# Patient Record
Sex: Male | Born: 1975 | Race: Black or African American | Hispanic: No | Marital: Single | Smoking: Former smoker
Health system: Southern US, Community
[De-identification: ages and names within clinical notes are randomized; demographics above are authoritative.]

## PROBLEM LIST (undated history)

## (undated) DIAGNOSIS — G4730 Sleep apnea, unspecified: Secondary | ICD-10-CM

## (undated) DIAGNOSIS — E785 Hyperlipidemia, unspecified: Secondary | ICD-10-CM

## (undated) DIAGNOSIS — I10 Essential (primary) hypertension: Secondary | ICD-10-CM

## (undated) HISTORY — DX: Essential (primary) hypertension: I10

## (undated) HISTORY — DX: Hyperlipidemia, unspecified: E78.5

## (undated) HISTORY — DX: Sleep apnea, unspecified: G47.30

## (undated) HISTORY — PX: WISDOM TOOTH EXTRACTION: SHX21

---

## 2003-10-21 DIAGNOSIS — I10 Essential (primary) hypertension: Secondary | ICD-10-CM

## 2003-10-21 HISTORY — DX: Essential (primary) hypertension: I10

## 2011-04-28 ENCOUNTER — Encounter: Payer: Self-pay | Admitting: Physician Assistant

## 2011-05-08 HISTORY — PX: FLEXIBLE SIGMOIDOSCOPY: SHX1649

## 2017-01-02 ENCOUNTER — Emergency Department (HOSPITAL_COMMUNITY): Payer: Self-pay

## 2017-01-02 ENCOUNTER — Emergency Department (HOSPITAL_COMMUNITY)
Admission: EM | Admit: 2017-01-02 | Discharge: 2017-01-02 | Disposition: A | Discharge status: Home / Self Care | Payer: Self-pay | Attending: Emergency Medicine | Admitting: Emergency Medicine

## 2017-01-02 ENCOUNTER — Encounter (HOSPITAL_COMMUNITY): Payer: Self-pay | Admitting: Nurse Practitioner

## 2017-01-02 DIAGNOSIS — X509XXA Other and unspecified overexertion or strenuous movements or postures, initial encounter: Secondary | ICD-10-CM | POA: Insufficient documentation

## 2017-01-02 DIAGNOSIS — Y9301 Activity, walking, marching and hiking: Secondary | ICD-10-CM | POA: Insufficient documentation

## 2017-01-02 DIAGNOSIS — Y999 Unspecified external cause status: Secondary | ICD-10-CM | POA: Insufficient documentation

## 2017-01-02 DIAGNOSIS — Y929 Unspecified place or not applicable: Secondary | ICD-10-CM | POA: Insufficient documentation

## 2017-01-02 DIAGNOSIS — S82831A Other fracture of upper and lower end of right fibula, initial encounter for closed fracture: Secondary | ICD-10-CM | POA: Insufficient documentation

## 2017-01-02 MED ORDER — OXYCODONE-ACETAMINOPHEN 5-325 MG PO TABS: 2.0000 | ORAL_TABLET | ORAL | 0 refills | Status: DC | PRN

## 2017-01-02 NOTE — ED Notes (Signed)
Bed: WTR8 Expected date:  Expected time:  Means of arrival:  Comments: 

## 2017-01-02 NOTE — Discharge Instructions (Signed)
Your x-rays showed an oblique fracture of your distal right fibula.  You need to call orthopedic surgeon as soon as you're able to be evaluated in clinic.  Please wear your splint and use crutches to ambulate until you are evaluated by orthopedic surgeon.  The orthopedic surgeon will determine further treatment necessary.   Take percocet for severe pain.  You can take 600 mg ibuprofen + 1000 mg tylenol for mild/moderate pain. Elevate your leg as often as possible when sitting or laying down.  You may ice if you are able to through the splint.    There is always a risk of compartment syndrome with fractures of large bones.  Return to the ED immediately if you have acutely worsening pain to injured area, area feels "tight", hot and severely swollen.

## 2017-01-02 NOTE — ED Provider Notes (Signed)
[facility]-EMERGENCY DEPT Provider Note   CSN: [id number] Arrival date & time: 01/02/17  1613  By signing my name below, I, Sonum Patel, attest that this documentation has been prepared under the direction and in the presence of Sharen Heck, PA-C. Electronically Signed: Leone Payor, Scribe. 01/02/17. 5:26 PM.  History   Chief Complaint Chief Complaint  Patient presents with  . Ankle Pain    Right ankle     The history is provided by the patient. No language interpreter was used.     HPI Comments: Joel [name] is a 41 y.o. male who presents to the Emergency Department complaining of a left ankle injury that occurred this morning. Patient states he was walking down steps when he inverted his left ankle. He denies hearing any popping or cracking noises when the injury occurred. He currently complains of constant, gradually worsened left ankle pain with associated swelling. Patient states initially he was able to take a few steps but then the pain and swelling worsened. He has elevated the affected area and applied ice without relief. He denies numbness, paresthesia.   History reviewed. No pertinent past medical history.  There are no active problems to display for this patient.   History reviewed. No pertinent surgical history.     Home Medications    Prior to Admission medications   Medication Sig Start Date End Date Taking? Authorizing Provider  oxyCODONE-acetaminophen (PERCOCET/ROXICET) 5-325 MG tablet Take 2 tablets by mouth every 4 (four) hours as needed for severe pain. 01/02/17   Liberty Handy, PA-C    Family History History reviewed. No pertinent family history.  Social History Social History  Substance Use Topics  . Smoking status: Never Smoker  . Smokeless tobacco: Never Used  . Alcohol use No     Allergies   Patient has no known allergies.   Review of Systems Review of Systems  Musculoskeletal: Positive for arthralgias and joint swelling.    Neurological: Negative for numbness.     Physical Exam Updated Vital Signs BP 132/85 (BP Location: Left Arm)   Pulse 78   Temp 98.9 F (37.2 C) (Oral)   Resp 18   Ht 5\' 7"  (1.702 m)   Wt 74.8 kg   SpO2 99%   BMI 25.84 kg/m   Physical Exam  Constitutional: He is oriented to person, place, and time. He appears well-developed and well-nourished. No distress.  HENT:  Head: Normocephalic and atraumatic.  Right Ear: External ear normal.  Left Ear: External ear normal.  Nose: Nose normal.  Mouth/Throat: Oropharynx is clear and moist. No oropharyngeal exudate.  Eyes: Conjunctivae and EOM are normal. Pupils are equal, round, and reactive to light. No scleral icterus.  Neck: Normal range of motion. Neck supple. No JVD present. No tracheal deviation present.  Cardiovascular: Normal rate, regular rhythm, normal heart sounds and intact distal pulses.   No murmur heard. Pulmonary/Chest: Effort normal and breath sounds normal. He has no wheezes.  Abdominal: Soft. He exhibits no distension. There is no tenderness.  Musculoskeletal: Normal range of motion. He exhibits edema and tenderness. He exhibits no deformity.  Tenderness to distal fibula, posterior lateral malleolus and over ATLF. Obvious lateral ankle and lateral lower leg edema which is also mild. + Talar test  No obvious ecchymosis, erythema or skin abrasions. Full passive ROM of bilateral ankles with pain but without crepitus.  Patient able to bear weight in ED (4+ steps).   No bony tenderness over posterior aspect of medial malleoli, navicular  bone or 5th metatarsal base.   Achilles tendon is non tender. Negative anterior and posterior drawer.  Negative syndesmosis squeeze test. Negative Thompson test.    Lymphadenopathy:    He has no cervical adenopathy.  Neurological: He is alert and oriented to person, place, and time. No sensory deficit.  Antalgic gait favoring RLE  5/5 strength with ankle dorsiflexion and plantar  flexion, bilaterally.  Sensation to light touch intact in the distribution of the obturator nerve, lateral cutaneous nerve, femoral nerve, common fibular nerve and peroneal nerve.  Foot: sensation to light touch intact in the distribution of the saphenous nerve, medial plantar nerve, lateral plantar nerve, bilaterally.   Skin: Skin is warm and dry. Capillary refill takes less than 2 seconds.  Psychiatric: He has a normal mood and affect. His behavior is normal. Judgment and thought content normal.  Nursing note and vitals reviewed.    ED Treatments / Results  DIAGNOSTIC STUDIES: Oxygen Saturation is 98% on RA, normal by my interpretation.    COORDINATION OF CARE: 5:23 PM Discussed treatment plan with pt at bedside and pt agreed to plan.   Labs (all labs ordered are listed, but only abnormal results are displayed) Labs Reviewed - No data to display  EKG  EKG Interpretation None       Radiology Dg Ankle Complete Right  Result Date: 01/02/2017 CLINICAL DATA:  Pain swelling.  Fall . EXAM: RIGHT ANKLE - COMPLETE 3+ VIEW COMPARISON:  No recent prior . FINDINGS: Prominent soft tissue swelling noted laterally. No oblique fracture of the distal fibula is noted. Slight displacement. Medial malleolus intact. IMPRESSION: Oblique fracture of the distal fibula.  Slight displacement. Electronically Signed   By: Maisie Fus  Register   On: 01/02/2017 17:33    Procedures Procedures (including critical care time)  Medications Ordered in ED Medications - No data to display   Initial Impression / Assessment and Plan / ED Course  I have reviewed the triage vital signs and the nursing notes.  Pertinent labs & imaging results that were available during my care of the patient were reviewed by me and considered in my medical decision making (see chart for details).  Clinical Course as of Jan 03 1955  Fri Jan 02, 2017  1752 FINDINGS: Prominent soft tissue swelling noted laterally. No oblique  fracture of the distal fibula is noted. Slight displacement. Medial malleolus intact.  IMPRESSION: Oblique fracture of the distal fibula. Slight displacement.  [CG]    Clinical Course User Index [CG] Liberty Handy, PA-C   X-ray showed oblique fracture of the distal fibula without involvement of the ankle joint. Marena Chancy B fracture. Right lower extremity is neurovascularly intact. No break in the skin. No signs of compartment syndrome. Discussed x-ray findings with patient. We'll discharge with short course of Percocet, splint, crutches and follow up with orthopedic surgeon for reevaluation and possible surgery. Patient verbalized understanding and is agreeable to discharge plan.  X-rays and discharge plan were discussed with supervising physician who agrees with ED treatment and disposition.   Final Clinical Impressions(s) / ED Diagnoses   Final diagnoses:  Other closed fracture of distal end of right fibula, initial encounter    New Prescriptions Discharge Medication List as of 01/02/2017  6:29 PM    START taking these medications   Details  oxyCODONE-acetaminophen (PERCOCET/ROXICET) 5-325 MG tablet Take 2 tablets by mouth every 4 (four) hours as needed for severe pain., Starting Fri 01/02/2017, Print       I personally performed  the services described in this documentation, which was scribed in my presence. The recorded information has been reviewed and is accurate.    Liberty Handylaudia J Angeles Zehner, PA-C 01/02/17 1956    Melene Planan Floyd, DO 01/02/17 2327

## 2017-01-02 NOTE — ED Triage Notes (Signed)
Pt states he twisted his right ankle putting all his weight on the foot, obvious swelling and inflammation, pt states icing and elevation have not helped.

## 2017-01-30 ENCOUNTER — Ambulatory Visit (HOSPITAL_COMMUNITY)
Admission: EM | Admit: 2017-01-30 | Discharge: 2017-01-30 | Disposition: A | Discharge status: Home / Self Care | Payer: Self-pay | Attending: Family Medicine | Admitting: Family Medicine

## 2017-01-30 ENCOUNTER — Encounter (HOSPITAL_COMMUNITY): Payer: Self-pay | Admitting: Family Medicine

## 2017-01-30 DIAGNOSIS — H9202 Otalgia, left ear: Secondary | ICD-10-CM

## 2017-01-30 DIAGNOSIS — H6002 Abscess of left external ear: Secondary | ICD-10-CM

## 2017-01-30 MED ORDER — AMOXICILLIN-POT CLAVULANATE 875-125 MG PO TABS: 1.0000 | ORAL_TABLET | Freq: Two times a day (BID) | ORAL | 0 refills | Status: DC

## 2017-01-30 NOTE — ED Triage Notes (Signed)
Pt here for left ear pain x 2 days 

## 2017-01-30 NOTE — ED Provider Notes (Signed)
CSN: [id number]     Arrival date & time 01/30/17  1030 History   None    Chief Complaint  Patient presents with  . Otalgia   (Consider location/radiation/quality/duration/timing/severity/associated sxs/prior Treatment) Patient c/o left ear pain for 2 days.   The history is provided by the patient.  Otalgia  Location:  Left Behind ear:  Redness and swelling Quality:  Aching Severity:  Moderate Onset quality:  Sudden Duration:  2 days Timing:  Constant Progression:  Worsening Chronicity:  New Relieved by:  Nothing Worsened by:  Nothing Ineffective treatments:  None tried   History reviewed. No pertinent past medical history. History reviewed. No pertinent surgical history. History reviewed. No pertinent family history. Social History  Substance Use Topics  . Smoking status: Never Smoker  . Smokeless tobacco: Never Used  . Alcohol use No    Review of Systems  Constitutional: Negative.   HENT: Positive for ear pain.   Eyes: Negative.   Respiratory: Negative.   Cardiovascular: Negative.   Gastrointestinal: Negative.   Endocrine: Negative.   Genitourinary: Negative.   Musculoskeletal: Negative.   Skin: Positive for wound.  Allergic/Immunologic: Negative.   Neurological: Negative.   Hematological: Negative.   Psychiatric/Behavioral: Negative.     Allergies  Patient has no known allergies.  Home Medications   Prior to Admission medications   Medication Sig Start Date End Date Taking? Authorizing Provider  amoxicillin-clavulanate (AUGMENTIN) 875-125 MG tablet Take 1 tablet by mouth 2 (two) times daily. 01/30/17   Deatra Canter, FNP  oxyCODONE-acetaminophen (PERCOCET/ROXICET) 5-325 MG tablet Take 2 tablets by mouth every 4 (four) hours as needed for severe pain. 01/02/17   Liberty Handy, PA-C   Meds Ordered and Administered this Visit  Medications - No data to display  BP 126/70   Pulse 98   Temp 98.6 F (37 C)   Resp 18   SpO2 99%  No data  found.   Physical Exam  Constitutional: He appears well-developed and well-nourished.  HENT:  Head: Normocephalic and atraumatic.  Right Ear: External ear normal.  Mouth/Throat: Oropharynx is clear and moist.  Left Tragus with cellulitis and cyst  Eyes: Conjunctivae and EOM are normal. Pupils are equal, round, and reactive to light.  Neck: Normal range of motion. Neck supple.  Cardiovascular: Normal rate, regular rhythm and normal heart sounds.   Pulmonary/Chest: Effort normal and breath sounds normal.  Abdominal: Soft. Bowel sounds are normal.  Nursing note and vitals reviewed.   Urgent Care Course     .Marland KitchenIncision and Drainage Date/Time: 01/30/2017 11:27 AM Performed by: Deatra Canter Authorized by: Hazeline Junker B   Consent:    Consent obtained:  Verbal   Consent given by:  Patient   Risks discussed:  Bleeding   Alternatives discussed:  No treatment Location:    Type:  Abscess   Size:  1   Location:  Head   Head location:  L external ear Pre-procedure details:    Skin preparation:  Betadine Anesthesia (see MAR for exact dosages):    Anesthesia method:  Local infiltration   Local anesthetic:  Lidocaine 1% w/o epi Procedure type:    Complexity:  Simple Procedure details:    Needle aspiration: yes     Needle size:  18 G   Incision types:  Stab incision   Incision depth:  Dermal   Drainage:  Purulent   Drainage amount:  Scant   Wound treatment:  Wound left open   Packing materials:  None   (  including critical care time)  Labs Review Labs Reviewed - No data to display  Imaging Review No results found.   Visual Acuity Review  Right Eye Distance:   Left Eye Distance:   Bilateral Distance:    Right Eye Near:   Left Eye Near:    Bilateral Near:         MDM   1. Otalgia of left ear   2. Abscess of tragus of left ear    Incision and Drainage Augmentin  one po bid x10 days #20  Motrin and Tylenol otc as directed      Deatra Canter, FNP 01/30/17 1129

## 2018-02-19 ENCOUNTER — Ambulatory Visit: Payer: BLUE CROSS/BLUE SHIELD | Admitting: Physician Assistant

## 2018-02-19 ENCOUNTER — Encounter: Payer: Self-pay | Admitting: Physician Assistant

## 2018-02-19 VITALS — BP 120/92 | HR 79 | Temp 98.6°F | Ht 66.5 in | Wt 172.0 lb

## 2018-02-19 DIAGNOSIS — I10 Essential (primary) hypertension: Secondary | ICD-10-CM | POA: Diagnosis not present

## 2018-02-19 DIAGNOSIS — Z7689 Persons encountering health services in other specified circumstances: Secondary | ICD-10-CM

## 2018-02-19 DIAGNOSIS — Z23 Encounter for immunization: Secondary | ICD-10-CM

## 2018-02-19 DIAGNOSIS — R1030 Lower abdominal pain, unspecified: Secondary | ICD-10-CM

## 2018-02-19 LAB — URINALYSIS, ROUTINE W REFLEX MICROSCOPIC
Bilirubin Urine: NEGATIVE
Hgb urine dipstick: NEGATIVE
KETONES UR: NEGATIVE
Leukocytes, UA: NEGATIVE
Nitrite: NEGATIVE
PH: 6.5 (ref 5.0–8.0)
RBC / HPF: NONE SEEN (ref 0–?)
SPECIFIC GRAVITY, URINE: 1.02 (ref 1.000–1.030)
Total Protein, Urine: NEGATIVE
UROBILINOGEN UA: 0.2 (ref 0.0–1.0)
Urine Glucose: NEGATIVE

## 2018-02-19 LAB — CBC
HEMATOCRIT: 44.5 % (ref 39.0–52.0)
HEMOGLOBIN: 15.3 g/dL (ref 13.0–17.0)
MCHC: 34.4 g/dL (ref 30.0–36.0)
MCV: 94 fl (ref 78.0–100.0)
Platelets: 184 10*3/uL (ref 150.0–400.0)
RBC: 4.74 Mil/uL (ref 4.22–5.81)
RDW: 14 % (ref 11.5–15.5)
WBC: 3.6 10*3/uL — ABNORMAL LOW (ref 4.0–10.5)

## 2018-02-19 LAB — LIPASE: Lipase: 10 U/L — ABNORMAL LOW (ref 11.0–59.0)

## 2018-02-19 LAB — COMPREHENSIVE METABOLIC PANEL
ALK PHOS: 79 U/L (ref 39–117)
ALT: 27 U/L (ref 0–53)
AST: 23 U/L (ref 0–37)
Albumin: 4.1 g/dL (ref 3.5–5.2)
BILIRUBIN TOTAL: 0.5 mg/dL (ref 0.2–1.2)
BUN: 13 mg/dL (ref 6–23)
CALCIUM: 9.5 mg/dL (ref 8.4–10.5)
CO2: 31 mEq/L (ref 19–32)
CREATININE: 1.04 mg/dL (ref 0.40–1.50)
Chloride: 103 mEq/L (ref 96–112)
GFR: 99.95 mL/min (ref >60.00)
Glucose, Bld: 93 mg/dL (ref 70–99)
Potassium: 4.7 mEq/L (ref 3.5–5.1)
Sodium: 140 mEq/L (ref 135–145)
TOTAL PROTEIN: 7.2 g/dL (ref 6.0–8.3)

## 2018-02-19 NOTE — Progress Notes (Signed)
Joel [name] is a 42 y.o. male here to Establish Care.  I acted as a Neurosurgeon for [employer], PA-C Joel Mull, LPN  History of Present Illness:   Chief Complaint  Patient presents with  . Establish Care    BC/BS  . Abdominal Pain    Acute Concerns: Hypertension -- stopped medication; currently asymptomatic. Does snore at night. Smokes 1 pack per week.  1 coffee daily. No energy drinks. Occasional soda. Tries to limit salt. More likely to eat out at home.  Denies chest pain, shortness of breath, unusual headaches, lightheadedness, dizziness, lower leg swelling.  Loculated he was started on Toprol and hydrochlorothiazide.  After he left he stopped taking the medication, and felt that he needed anymore.  He does drink approximately 1.5 pints of liquor a week. Abdominal pain --about 1 month ago he started having symptoms.  He would have sharp pain about 1 minute and it would go away.  He said it was related to him drinking alcohol on empty stomach.  When he would eat or have a BM it would get better. Denies constipation, nausea, vomiting.  Denies any blood in his stool.  He does report a history of having rectal bleeding and has had a colonoscopy which to his recollection was normal; we are requesting records of this today.  For the past 2 weeks he has had absolutely no symptoms and the issue has essentially resolved.  Denies issues with urination or concern for STDs.  Health Maintenance: Immunizations -- UTD, Tdap given today Colonoscopy -- N/A PSA -- N/A Exercise -- slacks off Weight -- Weight: 172 lb (78 kg)   Depression screen PHQ 2/9 02/19/2018  Decreased Interest 0  Down, Depressed, Hopeless 0  PHQ - 2 Score 0    No flowsheet data found.  Other providers/specialists: none  Past Medical History:  Diagnosis Date  . Hypertension 2005   stopped meds in 2009     Social History   Socioeconomic History  . Marital status: Married    Spouse name: Not on file  .  Number of children: Not on file  . Years of education: Not on file  . Highest education level: Not on file  Occupational History  . Not on file  Social Needs  . Financial resource strain: Not on file  . Food insecurity:    Worry: Not on file    Inability: Not on file  . Transportation needs:    Medical: Not on file    Non-medical: Not on file  Tobacco Use  . Smoking status: Current Every Day Smoker    Types: Cigarettes  . Smokeless tobacco: Never Used  . Tobacco comment: 1 pack per week  Substance and Sexual Activity  . Alcohol use: Yes    Comment: 1.5 pints a week of liquor [name]  . Drug use: Not Currently    Types: Marijuana    Comment: 4 years ago  . Sexual activity: Yes  Lifestyle  . Physical activity:    Days per week: Not on file    Minutes per session: Not on file  . Stress: Not on file  Relationships  . Social connections:    Talks on phone: Not on file    Gets together: Not on file    Attends religious service: Not on file    Active member of club or organization: Not on file    Attends meetings of clubs or organizations: Not on file    Relationship status: Not  on file  . Intimate partner violence:    Fear of current or ex partner: Not on file    Emotionally abused: Not on file    Physically abused: Not on file    Forced sexual activity: Not on file  Other Topics Concern  . Not on file  Social History Narrative   Work for [employer] in [place]   1 daughter -- 44 y/o   Not married    Past Surgical History:  Procedure Laterality Date  . WISDOM TOOTH EXTRACTION      Family History  Problem Relation Age of Onset  . Hypertension Mother   . Hypertension Sister     No Known Allergies   Current Medications:  No current outpatient medications on file.   Review of Systems:   Review of Systems  Constitutional: Negative.  Negative for chills, fever, malaise/fatigue and weight loss.  HENT: Negative.  Negative for hearing loss, sinus pain and sore  throat.   Eyes: Negative.  Negative for blurred vision.  Respiratory: Negative.  Negative for cough and shortness of breath.   Cardiovascular: Negative.  Negative for chest pain, palpitations and leg swelling.  Gastrointestinal: Positive for abdominal pain (x 1 month). Negative for constipation, diarrhea, heartburn, nausea and vomiting.  Genitourinary: Negative.  Negative for dysuria, frequency and urgency.  Musculoskeletal: Negative for back pain, myalgias and neck pain.  Skin: Negative.  Negative for itching and rash.  Neurological: Negative.  Negative for dizziness, tingling, seizures, loss of consciousness and headaches.  Endo/Heme/Allergies: Negative.  Negative for polydipsia.  Psychiatric/Behavioral: Negative.  Negative for depression. The patient is not nervous/anxious.     Vitals:   Vitals:   02/19/18 1150  BP: (!) 126/100  Pulse: 79  Temp: 98.6 F (37 C)  TempSrc: Oral  SpO2: 98%  Weight: 172 lb (78 kg)  Height: 5' 6.5" (1.689 m)     Body mass index is 27.35 kg/m.  Physical Exam:   Physical Exam  Constitutional: He appears well-developed. He is cooperative.  Non-toxic appearance. He does not have a sickly appearance. He does not appear ill. No distress.  Cardiovascular: Normal rate, regular rhythm, S1 normal, S2 normal, normal heart sounds and normal pulses.  No LE edema  Pulmonary/Chest: Effort normal and breath sounds normal.  Abdominal: Normal appearance and bowel sounds are normal. There is no tenderness. There is no rigidity, no rebound, no guarding, no CVA tenderness, no tenderness at McBurney's point and negative Murphy's sign.  Neurological: He is alert. GCS eye subscore is 4. GCS verbal subscore is 5. GCS motor subscore is 6.  Skin: Skin is warm, dry and intact.  Psychiatric: He has a normal mood and affect. His speech is normal and behavior is normal.  Nursing note and vitals reviewed.  EKG tracing is personally reviewed.  EKG notes NSR.  No acute changes.    Assessment and Plan:    Joel [name] was seen today for establish care and abdominal pain.  Diagnoses and all orders for this visit:  Encounter to establish care  Need for prophylactic vaccination with combined diphtheria-tetanus-pertussis (DTP) vaccine -     Tdap vaccine greater than or equal to 7yo IM  Essential hypertension Blood pressure normal on recheck 125/90.  However given his history I am going to have him keep a log, his mother who he lives with, has a blood pressure monitor.  He is going to follow-up with [place] within a month with these readings so we can discuss further -- sooner  if symptoms develop.  I am also going to get some baseline labs today. EKG tracing is personally reviewed.  EKG notes NSR.  No acute changes.  We did also briefly talk about reducing his risk factors by decreasing tobacco and alcohol use. Consider initiation of low dose Norvasc at f/u if warranted. Also consider sleep study if diastolic remains elevated. -     Comprehensive metabolic panel -     CBC -     EKG 12-Lead  Lower abdominal pain He is currently asymptomatic.  Exam was benign.  Labs and urine today.  I discussed reduction of alcohol intake.  I recommended bland diet.  I discussed that if his symptoms return, we should pursue imaging.  We are checking labs to assess for any other cause.  Patient is agreeable to plan, and verbalized that he would keep [place] posted if his symptoms returned.  We are also requesting records to get his colonoscopy report. -     Lipase -     Urinalysis, Routine w reflex microscopic -     CBC    . Reviewed expectations re: course of current medical issues. . Discussed self-management of symptoms. . Outlined signs and symptoms indicating need for more acute intervention. . Patient verbalized understanding and all questions were answered. . See orders for this visit as documented in the electronic medical record. . Patient received an After-Visit Summary.   CMA or LPN  served as scribe during this visit. History, Physical, and Plan performed by medical provider. Documentation and orders reviewed and attested to.   [name], PA-C

## 2018-02-19 NOTE — Patient Instructions (Addendum)
It was great to meet you!  Limit alcohol, and if you do drink, do not drink excessive amounts.  We are going to check a few baseline labs today. We will contact you with results.  Please check your blood pressure approximately 3 times a week, write this down for [place] so we can review with you at your next visit.  Follow-up in 2-4 weeks so we can review your blood pressure.  If your abdominal pain returns, let us know. We may need to get an ultrasound or other imaging to evaluate.

## 2018-03-02 ENCOUNTER — Encounter: Payer: Self-pay | Admitting: Physician Assistant

## 2018-03-19 ENCOUNTER — Ambulatory Visit: Payer: BLUE CROSS/BLUE SHIELD | Admitting: Physician Assistant

## 2018-03-19 ENCOUNTER — Encounter: Payer: Self-pay | Admitting: Physician Assistant

## 2018-03-19 DIAGNOSIS — I10 Essential (primary) hypertension: Secondary | ICD-10-CM

## 2018-03-19 MED ORDER — AMLODIPINE BESYLATE 5 MG PO TABS: 5.0000 mg | ORAL_TABLET | Freq: Every day | ORAL | 1 refills | Status: DC

## 2018-03-19 NOTE — Progress Notes (Signed)
Joel [name] is a 42 y.o. male is here to discuss: Hypertension   I acted as a Neurosurgeon for [employer], PA-C Corky Mull, LPN  History of Present Illness:   Chief Complaint  Patient presents with  . hypertension f/u    pt states doing well    Hypertension  This is a chronic problem. Episode onset: Pt here for follow up on blood pressure. Has been checking at home and is ranging Systolic 124-139, Diastokic 74-101 since last visit. The problem is unchanged. The problem is uncontrolled. Pertinent negatives include no anxiety, blurred vision, chest pain, headaches, malaise/fatigue, neck pain, palpitations, peripheral edema or shortness of breath. Associated agents: Pt has cut down drinking to twice a week, 3-4 drinks liquior. Risk factors for coronary artery disease include family history. Improvement on treatment: Pt is presently not on medication. Compliance problems: No exercise in the past month.    He is trying to reduce his alcohol and smoking.  He has not done any exercise since he last saw me, he is up about 5 pounds.     BP Readings from Last 3 Encounters:  03/19/18 132/88  02/19/18 (!) 120/92  01/30/17 126/70   Wt Readings from Last 5 Encounters:  03/19/18 177 lb (80.3 kg)  02/19/18 172 lb (78 kg)  01/02/17 165 lb (74.8 kg)      Health Maintenance Due  Topic Date Due  . HIV Screening  07/06/1989    Past Medical History:  Diagnosis Date  . Hypertension 2005   stopped meds in 2009     Social History   Socioeconomic History  . Marital status: Single    Spouse name: Not on file  . Number of children: Not on file  . Years of education: Not on file  . Highest education level: Not on file  Occupational History  . Not on file  Social Needs  . Financial resource strain: Not on file  . Food insecurity:    Worry: Not on file    Inability: Not on file  . Transportation needs:    Medical: Not on file    Non-medical: Not on file  Tobacco Use  .  Smoking status: Current Every Day Smoker    Types: Cigarettes  . Smokeless tobacco: Never Used  . Tobacco comment: 1 pack per week  Substance and Sexual Activity  . Alcohol use: Yes    Comment: 1.5 pints a week of liquor [name]  . Drug use: Not Currently    Types: Marijuana    Comment: 4 years ago  . Sexual activity: Yes  Lifestyle  . Physical activity:    Days per week: Not on file    Minutes per session: Not on file  . Stress: Not on file  Relationships  . Social connections:    Talks on phone: Not on file    Gets together: Not on file    Attends religious service: Not on file    Active member of club or organization: Not on file    Attends meetings of clubs or organizations: Not on file    Relationship status: Not on file  . Intimate partner violence:    Fear of current or ex partner: Not on file    Emotionally abused: Not on file    Physically abused: Not on file    Forced sexual activity: Not on file  Other Topics Concern  . Not on file  Social History Narrative   Work for [employer] in     1 daughter -- 63 y/o   Not married    Past Surgical History:  Procedure Laterality Date  . FLEXIBLE SIGMOIDOSCOPY  05/08/2011   Reason for exam: hematochezia; Findings: internal and external hemorrhoids  . WISDOM TOOTH EXTRACTION      Family History  Problem Relation Age of Onset  . Hypertension Mother   . Alcohol abuse Mother   . Asthma Mother   . COPD Mother   . Drug abuse Mother   . Heart disease Mother   . Hypertension Sister   . Alcohol abuse Father   . Drug abuse Father   . Alcohol abuse Maternal Grandmother   . Alcohol abuse Maternal Grandfather   . Drug abuse Maternal Grandfather     PMHx, SurgHx, SocialHx, FamHx, Medications, and Allergies were reviewed in the Visit Navigator and updated as appropriate.   Patient Active Problem List   Diagnosis Date Noted  . Hypertension 02/19/2018    Social History   Tobacco Use  . Smoking status: Current  Every Day Smoker    Types: Cigarettes  . Smokeless tobacco: Never Used  . Tobacco comment: 1 pack per week  Substance Use Topics  . Alcohol use: Yes    Comment: 1.5 pints a week of liquor [name]  . Drug use: Not Currently    Types: Marijuana    Comment: 4 years ago    Current Medications and Allergies:    Current Outpatient Medications:  .  amLODipine (NORVASC) 5 MG tablet, Take 1 tablet (5 mg total) by mouth daily., Disp: 30 tablet, Rfl: 1  No Known Allergies  Review of Systems   Review of Systems  Constitutional: Negative for malaise/fatigue.  Eyes: Negative for blurred vision.  Respiratory: Negative for shortness of breath.   Cardiovascular: Negative for chest pain and palpitations.  Musculoskeletal: Negative for neck pain.  Neurological: Negative for headaches.    Vitals:   Vitals:   03/19/18 0859  BP: 132/88  Pulse: 71  Temp: 97.9 F (36.6 C)  TempSrc: Oral  SpO2: 97%  Weight: 177 lb (80.3 kg)  Height: 5' 6.5" (1.689 m)     Body mass index is 28.14 kg/m.   Physical Exam:    Physical Exam  Constitutional: He appears well-developed. He is cooperative.  Non-toxic appearance. He does not have a sickly appearance. He does not appear ill. No distress.  Cardiovascular: Normal rate, regular rhythm, S1 normal, S2 normal, normal heart sounds and normal pulses.  No LE edema  Pulmonary/Chest: Effort normal and breath sounds normal.  Neurological: He is alert. GCS eye subscore is 4. GCS verbal subscore is 5. GCS motor subscore is 6.  Skin: Skin is warm, dry and intact.  Psychiatric: He has a normal mood and affect. His speech is normal and behavior is normal.  Nursing note and vitals reviewed.    Assessment and Plan:    Joel [name] was seen today for hypertension f/u.  Diagnoses and all orders for this visit:  Essential hypertension  Other orders -     amLODipine (NORVASC) 5 MG tablet; Take 1 tablet (5 mg total) by mouth daily.   Blood pressure log  reveals systolic is most often greater than 90.  Continue to work on exercise, reducing alcohol intake, and reduction in smoking.  We will start Norvasc 5 mg today, follow-up with [place] in 1 month.  He continues to have headaches.  Patient is agreeable to plan.  . Reviewed expectations re: course of current medical issues. Marland Kitchen  Discussed self-management of symptoms. . Outlined signs and symptoms indicating need for more acute intervention. . Patient verbalized understanding and all questions were answered. . See orders for this visit as documented in the electronic medical record. . Patient received an After Visit Summary.   CMA or LPN served as scribe during this visit. History, Physical, and Plan performed by medical provider. Documentation and orders reviewed and attested to.   [name], PA-C [place] 03/19/2018  Follow-up: Return in about 1 month (around 04/18/2018) for Blood Pressure F/U.

## 2018-03-19 NOTE — Patient Instructions (Signed)
It was great to see you, lets follow-up in about 1 month.  Start the new medication today.  Continue to work on reducing alcohol and smoking.

## 2018-04-19 ENCOUNTER — Ambulatory Visit: Payer: BLUE CROSS/BLUE SHIELD | Admitting: Physician Assistant

## 2018-04-19 ENCOUNTER — Encounter: Payer: Self-pay | Admitting: Physician Assistant

## 2018-04-19 VITALS — BP 120/90 | HR 76 | Temp 97.9°F | Ht 66.5 in | Wt 175.4 lb

## 2018-04-19 DIAGNOSIS — Z114 Encounter for screening for human immunodeficiency virus [HIV]: Secondary | ICD-10-CM | POA: Diagnosis not present

## 2018-04-19 DIAGNOSIS — R400 Somnolence: Secondary | ICD-10-CM

## 2018-04-19 DIAGNOSIS — Z13220 Encounter for screening for lipoid disorders: Secondary | ICD-10-CM | POA: Diagnosis not present

## 2018-04-19 DIAGNOSIS — Z720 Tobacco use: Secondary | ICD-10-CM | POA: Insufficient documentation

## 2018-04-19 DIAGNOSIS — R0683 Snoring: Secondary | ICD-10-CM

## 2018-04-19 DIAGNOSIS — I10 Essential (primary) hypertension: Secondary | ICD-10-CM

## 2018-04-19 DIAGNOSIS — Z136 Encounter for screening for cardiovascular disorders: Secondary | ICD-10-CM | POA: Diagnosis not present

## 2018-04-19 DIAGNOSIS — E663 Overweight: Secondary | ICD-10-CM | POA: Insufficient documentation

## 2018-04-19 LAB — LIPID PANEL
CHOLESTEROL: 178 mg/dL (ref 0–200)
HDL: 42.9 mg/dL (ref >39.00)
LDL Cholesterol: 123 mg/dL — ABNORMAL HIGH (ref 0–99)
NONHDL: 135.15
Total CHOL/HDL Ratio: 4
Triglycerides: 63 mg/dL (ref 0.0–149.0)
VLDL: 12.6 mg/dL (ref 0.0–40.0)

## 2018-04-19 MED ORDER — AMLODIPINE BESYLATE 5 MG PO TABS: 5.0000 mg | ORAL_TABLET | Freq: Every day | ORAL | 1 refills | Status: DC

## 2018-04-19 NOTE — Patient Instructions (Signed)
It was great to see you today!  If a referral was placed today (sleep study), you will be contacted for an appointment. Please note that routine referrals can sometimes take up to 3-4 weeks to process. Please call our office if you haven't heard anything after this time frame.  Let's follow-up in 3 months -- sooner if you have concerns.

## 2018-04-19 NOTE — Progress Notes (Signed)
Joel [name] is a 42 y.o. male is here to discuss: Hypertension  I acted as a Neurosurgeon for [employer], PA-C Corky Mull, LPN  History of Present Illness:   Chief Complaint  Patient presents with  . Hypertension    Hypertension  This is a chronic problem. Episode onset: Pt here for follow up on blood pressure has been checking blood pressure at home every other day. Patient states that his average has been 130 systolic and 88 diastolic since last visit. The problem is uncontrolled. Pertinent negatives include no anxiety, blurred vision, chest pain, headaches, malaise/fatigue, neck pain, palpitations, peripheral edema or shortness of breath. Risk factors for coronary artery disease include smoking/tobacco exposure and family history. Past treatments include calcium channel blockers. The current treatment provides mild improvement. Compliance problems: Pt taking medication daily, tolerating well. Has not been exercising .    His sister was just diagnosed with sleep apnea, now has to wear a sleep mask. He does snore and has intermittent issues with daytime somnolence.  Health Maintenance Due  Topic Date Due  . HIV Screening  07/06/1989    Past Medical History:  Diagnosis Date  . Hypertension 2005   stopped meds in 2009     Social History   Socioeconomic History  . Marital status: Single    Spouse name: Not on file  . Number of children: Not on file  . Years of education: Not on file  . Highest education level: Not on file  Occupational History  . Not on file  Social Needs  . Financial resource strain: Not on file  . Food insecurity:    Worry: Not on file    Inability: Not on file  . Transportation needs:    Medical: Not on file    Non-medical: Not on file  Tobacco Use  . Smoking status: Current Every Day Smoker    Types: Cigarettes  . Smokeless tobacco: Never Used  . Tobacco comment: 1 pack per week  Substance and Sexual Activity  . Alcohol use: Yes      Comment: 1.5 pints a week of liquor [name]  . Drug use: Not Currently    Types: Marijuana    Comment: 4 years ago  . Sexual activity: Yes  Lifestyle  . Physical activity:    Days per week: Not on file    Minutes per session: Not on file  . Stress: Not on file  Relationships  . Social connections:    Talks on phone: Not on file    Gets together: Not on file    Attends religious service: Not on file    Active member of club or organization: Not on file    Attends meetings of clubs or organizations: Not on file    Relationship status: Not on file  . Intimate partner violence:    Fear of current or ex partner: Not on file    Emotionally abused: Not on file    Physically abused: Not on file    Forced sexual activity: Not on file  Other Topics Concern  . Not on file  Social History Narrative   Work for [employer] in [place]   1 daughter -- 72 y/o   Not married    Past Surgical History:  Procedure Laterality Date  . FLEXIBLE SIGMOIDOSCOPY  05/08/2011   Reason for exam: hematochezia; Findings: internal and external hemorrhoids  . WISDOM TOOTH EXTRACTION      Family History  Problem Relation Age of Onset  .  Hypertension Mother   . Alcohol abuse Mother   . Asthma Mother   . COPD Mother   . Drug abuse Mother   . Heart disease Mother   . Hypertension Sister   . Alcohol abuse Father   . Drug abuse Father   . Alcohol abuse Maternal Grandmother   . Alcohol abuse Maternal Grandfather   . Drug abuse Maternal Grandfather     PMHx, SurgHx, SocialHx, FamHx, Medications, and Allergies were reviewed in the Visit Navigator and updated as appropriate.   Patient Active Problem List   Diagnosis Date Noted  . Snoring 04/19/2018  . Hypertension 02/19/2018    Social History   Tobacco Use  . Smoking status: Current Every Day Smoker    Types: Cigarettes  . Smokeless tobacco: Never Used  . Tobacco comment: 1 pack per week  Substance Use Topics  . Alcohol use: Yes     Comment: 1.5 pints a week of liquor [name]  . Drug use: Not Currently    Types: Marijuana    Comment: 4 years ago    Current Medications and Allergies:    Current Outpatient Medications:  .  amLODipine (NORVASC) 5 MG tablet, Take 1 tablet (5 mg total) by mouth daily., Disp: 90 tablet, Rfl: 1  No Known Allergies  Review of Systems   Review of Systems  Constitutional: Negative for malaise/fatigue.  Eyes: Negative for blurred vision.  Respiratory: Negative for shortness of breath.   Cardiovascular: Negative for chest pain and palpitations.  Musculoskeletal: Negative for neck pain.  Neurological: Negative for headaches.    Vitals:   Vitals:   04/19/18 1025  BP: 120/90  Pulse: 76  Temp: 97.9 F (36.6 C)  TempSrc: Oral  SpO2: 98%  Weight: 175 lb 6.1 oz (79.6 kg)  Height: 5' 6.5" (1.689 m)     Body mass index is 27.88 kg/m.   Physical Exam:    Physical Exam  Constitutional: He appears well-developed. He is cooperative.  Non-toxic appearance. He does not have a sickly appearance. He does not appear ill. No distress.  Cardiovascular: Normal rate, regular rhythm, S1 normal, S2 normal, normal heart sounds and normal pulses.  No LE edema  Pulmonary/Chest: Effort normal and breath sounds normal.  Neurological: He is alert. GCS eye subscore is 4. GCS verbal subscore is 5. GCS motor subscore is 6.  Skin: Skin is warm, dry and intact.  Psychiatric: He has a normal mood and affect. His speech is normal and behavior is normal.  Nursing note and vitals reviewed.    Assessment and Plan:    Joel [name] was seen today for hypertension.  Diagnoses and all orders for this visit:  Essential hypertension Home readings are consistently <140/90. Continue 5mg  Norvasc at this time. I do think a sleep study is warranted given his history.  Encounter for lipid screening for cardiovascular disease -     Lipid panel  Screening for HIV (human immunodeficiency virus) -     HIV  antibody  Daytime somnolence and Snoring Need to r/o sleep apnea. Patient is agreeable to plan.  Other orders -     amLODipine (NORVASC) 5 MG tablet; Take 1 tablet (5 mg total) by mouth daily.  . Reviewed expectations re: course of current medical issues. . Discussed self-management of symptoms. . Outlined signs and symptoms indicating need for more acute intervention. . Patient verbalized understanding and all questions were answered. . See orders for this visit as documented in the electronic medical record. . Patient  received an After Visit Summary.  CMA or LPN served as scribe during this visit. History, Physical, and Plan performed by medical provider. Documentation and orders reviewed and attested to.   [name], PA-C [place] 04/19/2018  Follow-up: Return in about 3 months (around 07/20/2018) for Blood Pressure F/U.

## 2018-04-20 LAB — HIV ANTIBODY (ROUTINE TESTING W REFLEX): HIV 1&2 Ab, 4th Generation: NONREACTIVE

## 2018-04-21 ENCOUNTER — Other Ambulatory Visit: Payer: Self-pay | Admitting: *Deleted

## 2018-04-21 MED ORDER — ATORVASTATIN CALCIUM 40 MG PO TABS: 40.0000 mg | ORAL_TABLET | Freq: Every day | ORAL | 0 refills | Status: DC

## 2018-06-17 ENCOUNTER — Ambulatory Visit (INDEPENDENT_AMBULATORY_CARE_PROVIDER_SITE_OTHER): Payer: BLUE CROSS/BLUE SHIELD | Admitting: Neurology

## 2018-06-17 ENCOUNTER — Encounter: Payer: Self-pay | Admitting: Neurology

## 2018-06-17 VITALS — BP 127/89 | HR 82 | Ht 67.0 in | Wt 169.0 lb

## 2018-06-17 DIAGNOSIS — G4730 Sleep apnea, unspecified: Secondary | ICD-10-CM

## 2018-06-17 DIAGNOSIS — Z720 Tobacco use: Secondary | ICD-10-CM

## 2018-06-17 DIAGNOSIS — G4710 Hypersomnia, unspecified: Secondary | ICD-10-CM

## 2018-06-17 DIAGNOSIS — I10 Essential (primary) hypertension: Secondary | ICD-10-CM

## 2018-06-17 DIAGNOSIS — R0683 Snoring: Secondary | ICD-10-CM

## 2018-06-17 NOTE — Progress Notes (Signed)
SLEEP MEDICINE CLINIC   Provider:  Melvyn Novasarmen  Joel [name], M D  Primary Care Physician:  Joel [name], Joel [name], Joel   Referring Provider: Jarold [name], Samantha, PA    Chief Complaint  Patient presents with  . New Patient (Initial Visit)    pt alone, rm 10. Pt here to assess if he has sleep apnea. pt has intermittent feeling of daytime sleepiness. His 44 year old sister was recently diganosed with sleep apnea and uses a CPAP machine. pt has been told that he snores and stops breathing    HPI:  Joel [name] is a 42 y.o. AA male patient  and seen here on 06-17-2018 as in a referral from Joel [name] .  I have the pleasure of meeting Joel [name] today, a 42 year old right-handed gentleman who has been told that he snores and quits breathing during his sleep.  His girlfriend further has described that he sometimes gasps or chokes.  She has felt that he struggles for the next breath during his sleep.  In spite of being [age] years young and father slender Joel [name] does have a history of elevated blood pressure.  He reports an average of 130 mmHg systolic over 88 mmHg diastolic and his home readings.  He does not have associated chest pain, dizziness, headaches or blurred vision.  He is taking medication for hypertension.  The report of his girlfriend and his sister's recent diagnosis with sleep apnea now wearing CPAP has made it more urgent for him to be evaluated.   He has also been daytime sleepiness than he likes.     Sleep habits are as follows: Dinner time is between 8-9 PM, his work is off at 5 PM. Does not commute for long distances. After dinner drinks may include alcohol, and the couple watches TV. The patient goes to bedroom by 11 PM and watches TV in bed, has it running all night. The bedroom is cool with a ceiling fan, but not quiet and dark. Flat bed , sleeps on his stomach, on 2-3 pillows. Going to sleep is not difficult , he can sleep through the night, no nocturia. No nightmares-  he does recall dreams.   Wakes up spontaneously at 8 , needs an alarm if he has work to do and set at 6 AM. That's when he is sleepier in daytime. If he naps it will be an hour.   Sleep medical history and family sleep history: sister , aged 44 has just been diagnosed with OSA. HTN affects mother.   Social history: lives with girlfriend, no children, no pets. Drinks alcohol, liquor- and caffeine : mountain dews , dr pepper and colas 2-3 a day/ sweet tea.  Smoker- 1/2 ppd, cigarettes- started smoking at age 89 .   Review of Systems: Out of a complete 14 system review, the patient complains of only the following symptoms, and all other reviewed systems are negative.  Wheezing, suspects asthma .   Epworth score  7/ 24  , Fatigue severity score 16/ 63   , depression score n/a    Social History   Socioeconomic History  . Marital status: Single    Spouse name: Not on file  . Number of children: Not on file  . Years of education: Not on file  . Highest education level: Not on file  Occupational History  . Not on file  Social Needs  . Financial resource strain: Not on file  . Food insecurity:    Worry: Not on file  Inability: Not on file  . Transportation needs:    Medical: Not on file    Non-medical: Not on file  Tobacco Use  . Smoking status: Current Every Day Smoker    Types: Cigarettes  . Smokeless tobacco: Never Used  . Tobacco comment: 1 pack per week  Substance and Sexual Activity  . Alcohol use: Yes    Alcohol/week: 3.0 - 4.0 standard drinks    Types: 3 - 4 Standard drinks or equivalent per week  . Drug use: Not Currently    Comment: 4 years ago  . Sexual activity: Yes  Lifestyle  . Physical activity:    Days per week: Not on file    Minutes per session: Not on file  . Stress: Not on file  Relationships  . Social connections:    Talks on phone: Not on file    Gets together: Not on file    Attends religious service: Not on file    Active member of club or  organization: Not on file    Attends meetings of clubs or organizations: Not on file    Relationship status: Not on file  . Intimate partner violence:    Fear of current or ex partner: Not on file    Emotionally abused: Not on file    Physically abused: Not on file    Forced sexual activity: Not on file  Other Topics Concern  . Not on file  Social History Narrative   Work for [employer] in [place]   1 daughter -- 74 y/o   Not married    Family History  Problem Relation Age of Onset  . Hypertension Mother   . Alcohol abuse Mother   . Asthma Mother   . COPD Mother   . Drug abuse Mother   . Heart disease Mother   . Hypertension Sister   . Sleep apnea Sister   . Alcohol abuse Father   . Drug abuse Father   . Alcohol abuse Maternal Grandmother   . Alcohol abuse Maternal Grandfather   . Drug abuse Maternal Grandfather   . Diabetes Maternal Grandfather     Past Medical History:  Diagnosis Date  . Hyperlipemia   . Hypertension 2005   stopped meds in 2009    Past Surgical History:  Procedure Laterality Date  . FLEXIBLE SIGMOIDOSCOPY  05/08/2011   Reason for exam: hematochezia; Findings: internal and external hemorrhoids  . WISDOM TOOTH EXTRACTION      Current Outpatient Medications  Medication Sig Dispense Refill  . amLODipine (NORVASC) 5 MG tablet Take 1 tablet (5 mg total) by mouth daily. 90 tablet 1  . atorvastatin (LIPITOR) 40 MG tablet Take 1 tablet (40 mg total) by mouth daily. 90 tablet 0   No current facility-administered medications for this visit.     Allergies as of 06/17/2018  . (No Known Allergies)    Vitals: BP 127/89   Pulse 82   Ht 5\' 7"  (1.702 m)   Wt 169 lb (76.7 kg)   BMI 26.47 kg/m  Last Weight:  Wt Readings from Last 1 Encounters:  06/17/18 169 lb (76.7 kg)   UJW:[id number] mass index is 26.47 kg/m.     Last Height:   Ht Readings from Last 1 Encounters:  06/17/18 5\' 7"  (1.702 m)    Physical exam:  General: The patient is awake, alert  and appears not in acute distress. The patient is well groomed. Head: Normocephalic, atraumatic. Neck is supple. Mallampati   neck circumference:16.5 .  Nasal airflow patent ,  Retrognathia is not seen.  Cardiovascular:  Regular rate and rhythm*, without  murmurs or carotid bruit, and without distended neck veins. Respiratory: Lungs are clear to auscultation. Skin:  Without evidence of edema, or rash Trunk: BMI is 26 kg/m2. The patient's posture is erect.  . Neurologic exam : The patient is awake and alert, oriented to place and time.   Memory subjective described as intact.  Attention span & concentration ability appears normal.  Speech is fluent,  without dysarthria, dysphonia or aphasia.  Mood and affect are appropriate.  Cranial nerves: Pupils are equal and briskly reactive to light. Funduscopic exam without evidence of pallor or edema. Extraocular movements  in vertical and horizontal planes intact and without nystagmus. Visual fields by finger perimetry are intact. Hearing to finger rub intact.  Facial sensation intact to fine touch. Facial motor strength is symmetric and tongue and uvula move midline. Shoulder shrug was symmetrical.   Motor exam:  Normal tone, muscle bulk and symmetric strength in all extremities. Sensory:  Fine touch, pinprick and vibration were tested in all extremities. Proprioception tested in the upper extremities was normal. Coordination: Finger-to-nose maneuver  normal without evidence of ataxia, dysmetria or tremor. Handwriting is unchanged.  Gait and station: Patient walks without assistive device and is able unassisted to climb up to the exam table. Strength within normal limits.Stance is stable and normal.  Deep tendon reflexes: in the  upper and lower extremities are symmetric and intact.   Assessment:  After physical and neurologic examination, review of laboratory studies,  Personal review of imaging studies, reports of other /same  Imaging studies,  results of polysomnography and / or neurophysiology testing and pre-existing records as far as provided in visit., my assessment is:     1) Mr. Bhardwaj has been witnessed to snore and pulse and his nocturnal breathing.  In addition he has developed wheezing at times not stable throughout the year, and he is a tobacco user smokes about half a pack per day.  Although this makes it more likely for him to have sleep apnea in spite of a normal index, of a normal neck circumference and upper airway shape.  His his sister was diagnosed with obstructive sleep apnea but had an additional risk factor of obesity.  He is not excessively fatigued or daytime sleepy but his girlfriend may look at these numbers a little differently.  I would like to screen him for sleep apnea and we will use a home sleep test device.  I will follow-up after the home sleep test has been interpreted.  In the meantime we will address some sleep hygiene issues those are the setting of bedtime and routines, keeping electronics out of the bedroom, reading in bed in a book was pages, and reducing the alcohol intake before he goes to bed.   The patient was advised of the nature of the diagnosed disorder , the treatment options and the  risks for general health and wellness arising from not treating the condition.  I spent more than 45 minutes of face to face time with the patient.  Greater than 50% of time was spent in counseling and coordination of care. We have discussed the diagnosis and differential and I answered the patient's questions.      No orders of the defined types were placed in this encounter.    No follow-ups on file.   Melvyn Novas, MD 06/17/2018, 3:19 PM  Certified in Neurology by [employer]BPN Certified  in Sleep Medicine by [facility] [address] [place], [place] [address]

## 2018-06-23 DIAGNOSIS — Z23 Encounter for immunization: Secondary | ICD-10-CM | POA: Diagnosis not present

## 2018-06-23 DIAGNOSIS — Z0000 Encounter for general adult medical examination without abnormal findings: Secondary | ICD-10-CM | POA: Diagnosis not present

## 2018-06-24 DIAGNOSIS — E7800 Pure hypercholesterolemia, unspecified: Secondary | ICD-10-CM | POA: Diagnosis not present

## 2018-06-24 DIAGNOSIS — I10 Essential (primary) hypertension: Secondary | ICD-10-CM | POA: Diagnosis not present

## 2018-06-24 DIAGNOSIS — Z0000 Encounter for general adult medical examination without abnormal findings: Secondary | ICD-10-CM | POA: Diagnosis not present

## 2018-07-05 ENCOUNTER — Ambulatory Visit (INDEPENDENT_AMBULATORY_CARE_PROVIDER_SITE_OTHER): Payer: BLUE CROSS/BLUE SHIELD | Admitting: Neurology

## 2018-07-05 DIAGNOSIS — G4710 Hypersomnia, unspecified: Secondary | ICD-10-CM

## 2018-07-05 DIAGNOSIS — G4733 Obstructive sleep apnea (adult) (pediatric): Secondary | ICD-10-CM | POA: Diagnosis not present

## 2018-07-05 DIAGNOSIS — Z720 Tobacco use: Secondary | ICD-10-CM

## 2018-07-05 DIAGNOSIS — G4730 Sleep apnea, unspecified: Secondary | ICD-10-CM

## 2018-07-05 DIAGNOSIS — R0683 Snoring: Secondary | ICD-10-CM

## 2018-07-05 DIAGNOSIS — I10 Essential (primary) hypertension: Secondary | ICD-10-CM

## 2018-07-07 LAB — BASIC METABOLIC PANEL
BUN: 13 (ref 4–21)
CREATININE: 1.3 (ref 0.6–1.3)
GLUCOSE: 128
POTASSIUM: 3.4 (ref 3.4–5.3)
Sodium: 136 — AB (ref 137–147)

## 2018-07-07 LAB — HEPATIC FUNCTION PANEL
ALT: 41 — AB (ref 10–40)
AST: 32 (ref 14–40)
Alkaline Phosphatase: 106 (ref 25–125)
BILIRUBIN, TOTAL: 0.5

## 2018-07-07 LAB — LIPID PANEL
CHOLESTEROL: 129 (ref 0–200)
HDL: 43 (ref 35–70)
LDL Cholesterol: 65
Triglycerides: 106 (ref 40–160)

## 2018-07-07 LAB — CBC AND DIFFERENTIAL
HEMATOCRIT: 41 (ref 41–53)
HEMOGLOBIN: 13.9 (ref 13.5–17.5)
Neutrophils Absolute: 2
Platelets: 214 (ref 150–399)
WBC: 5.1

## 2018-07-07 LAB — TSH: TSH: 1.52 (ref 0.41–5.90)

## 2018-07-09 NOTE — Addendum Note (Signed)
Addended by: Melvyn NovasHMEIER, Jenniferann Stuckert on: 07/09/2018 01:30 PM   Modules accepted: Orders

## 2018-07-09 NOTE — Procedures (Signed)
NAME:  Joel [name]                                              DOB: 11-19-1975 MEDICAL RECORD No:  [id number]                                         DOS:  07/05/2018 REFERRING PHYSICIAN: Jarold MottoSamantha Worley, PA STUDY PERFORMED: Home Sleep Study by apnea link HISTORY:  Joel [name] is a 42 y.o. male patient seen on 06-17-2018 in a referral from [place].   Joel [name] is a 42 year old right-handed gentleman who has been told that he snores and quits breathing during his sleep. His girlfriend further has described that he sometimes gasps or chokes.  She has felt that he struggles for the next breath during his sleep.  In spite of being [age] years young and rather slender, [name] does have a history of elevated blood pressure. He reports an average of 130 mmHg / 88 mmHg diastolic and his home readings. He does not have associated chest pain, dizziness, headaches or blurred vision. He is taking medication for hypertension.   The report of his girlfriend and his sister's recent diagnosis with sleep apnea and now being treated on CPAP has made it more urgent for him to be evaluated.   He has also been more daytime sleepy than he likes.  BMI: 26.4.  Epworth Sleepiness score endorsed at 7/24, FSS at 16/63 points.   STUDY RESULTS:  Total Recording Time: 7 hours 21 minutes, valid 6h 8 min.  Total Apnea/Hypopnea Index (AHI): 13.7 /h; RDI: 16.9 /h. Average Oxygen Saturation:  94 %, Lowest Oxygen Saturation: 70 %  Total Time in Oxygen Saturation below 89 %:  22.0 minutes  Average Heart Rate:  80 bpm (between 68 and 102 bpm) IMPRESSION: There is mild Obstructive Sleep Apnea present, associated with moderate snoring. RECOMMENDATION: This Apnea type can be treated by auto CPAP, dental device and treatment may benefit the control of hypertension.  I certify that I have reviewed the raw data recording prior to the issuance of this report in accordance with the standards of the American  Academy of Sleep Medicine (AASM). Melvyn Novasarmen Corinda Ammon, M.D.    07-09-2018    Medical Director of [facility], accredited by the AASM. Diplomat of the ABPN and ABSM.

## 2018-07-13 ENCOUNTER — Telehealth: Payer: Self-pay | Admitting: Neurology

## 2018-07-13 NOTE — Telephone Encounter (Signed)
-----   Message from Melvyn Novasarmen Dohmeier, MD sent at 07/09/2018  1:30 PM EDT ----- IMPRESSION: There is mild Obstructive Sleep Apnea present,  associated with moderate snoring. RECOMMENDATION: This Apnea type can be treated by auto CPAP,  dental device and treatment may benefit the control of  hypertension.  I ordered an autotitration device between CPAP 5-12 cm water for this patient, please fit mask for a patient wiht retrognathia and facial hair ( nasal pillow first to try) .

## 2018-07-13 NOTE — Telephone Encounter (Signed)
I called pt. I advised pt that Dr. Vickey Hugerohmeier reviewed their sleep study results and found that pt has mild sleep apnea. Dr. Vickey Hugerohmeier recommends that pt starts a auto CPAP or a dental device. I advised the pt of both options and he chose to use auto CPAP. I reviewed PAP compliance expectations with the pt. Pt is agreeable to starting a CPAP. I advised pt that an order will be sent to a DME, [facility], and [facility] will call the pt within about one week after they file with the pt's insurance. [facility] will show the pt how to use the machine, fit for masks, and troubleshoot the CPAP if needed. A follow up appt was made for insurance purposes with Butch PennyMegan Millikan, NP on Nov 22,2019 at 10:00 am. Pt verbalized understanding to arrive 15 minutes early and bring their CPAP. A letter with all of this information in it will be mailed to the pt as a reminder. I verified with the pt that the address we have on file is correct. Pt verbalized understanding of results. Pt had no questions at this time but was encouraged to call back if questions arise.

## 2018-07-20 ENCOUNTER — Ambulatory Visit: Payer: BLUE CROSS/BLUE SHIELD | Admitting: Physician Assistant

## 2018-07-20 ENCOUNTER — Encounter: Payer: Self-pay | Admitting: Physician Assistant

## 2018-07-20 VITALS — BP 128/90 | HR 78 | Temp 97.8°F | Ht 67.0 in | Wt 173.0 lb

## 2018-07-20 DIAGNOSIS — E785 Hyperlipidemia, unspecified: Secondary | ICD-10-CM

## 2018-07-20 DIAGNOSIS — I10 Essential (primary) hypertension: Secondary | ICD-10-CM

## 2018-07-20 DIAGNOSIS — R7309 Other abnormal glucose: Secondary | ICD-10-CM | POA: Diagnosis not present

## 2018-07-20 LAB — T4: T4 TOTAL: 7.2

## 2018-07-20 LAB — FREE THYROXINE INDEX: FREE THYROXINE INDEX: 2.4

## 2018-07-20 LAB — POCT GLYCOSYLATED HEMOGLOBIN (HGB A1C): Hemoglobin A1C: 5.5 % (ref 4.0–5.6)

## 2018-07-20 LAB — T3 UPTAKE: T3 UPTAKE: 34

## 2018-07-20 MED ORDER — ATORVASTATIN CALCIUM 20 MG PO TABS: 20.0000 mg | ORAL_TABLET | Freq: Every day | ORAL | 0 refills | Status: DC

## 2018-07-20 NOTE — Patient Instructions (Addendum)
It was great to see you!  You DO NOT have diabetes :)  Keep working on cutting down smoking.  Let's decrease your cholesterol medication from 40 mg Lipitor to 20 mg Lipitor, I have sent this in.  Let's continue Norvasc 5 mg.  Follow-up in 3 months. Keep checking your blood pressures, especially after you have been using your CPAP regularly for a few weeks. If for some reason your blood pressures are >140/90 consistently, come back and see [place] sooner.

## 2018-07-20 NOTE — Progress Notes (Signed)
Joel [name] is a 42 y.o. male is here to discuss: Hypertension  I acted as a Neurosurgeon for [employer], PA-C Corky Mull, LPN  History of Present Illness:   Chief Complaint  Patient presents with  . Hypertension    Hypertension  This is a chronic problem. Episode onset: Pt is here for one month follow up on blood pressure. Pt has been checking blood pressure every other day. Pt says running Systolic 130's and Diastolic high 80's to mid 90's. The problem has been waxing and waning since onset. Pertinent negatives include no blurred vision, chest pain, headaches, peripheral edema or shortness of breath. Risk factors for coronary artery disease include smoking/tobacco exposure. Past treatments include calcium channel blockers. There are no compliance problems.    He is still smoking, thinking about taking Chantix. He has been diagnosed with sleep apnea. He is planning to pick up his CPAP next week, thus has not been using it yet.  HLD  Cholesterol panel was obtained for work physical. Numbers have greatly improved! Total cholesterol went from 178 to 129 LDL went from 123 to 65  He continues on Lipitor 40 mg, tolerating well.  Elevated glucose His work also performed a CMP. Blood sugar was 128. Was concerned that he has diabetes. Denies polyuria, polydipsia. States that prior to that blood work he had a biscuit for breakfast.   There are no preventive care reminders to display for this patient.  Past Medical History:  Diagnosis Date  . Hyperlipemia   . Hypertension 2005   stopped meds in 2009     Social History   Socioeconomic History  . Marital status: Single    Spouse name: Not on file  . Number of children: Not on file  . Years of education: Not on file  . Highest education level: Not on file  Occupational History  . Not on file  Social Needs  . Financial resource strain: Not on file  . Food insecurity:    Worry: Not on file    Inability: Not on file   . Transportation needs:    Medical: Not on file    Non-medical: Not on file  Tobacco Use  . Smoking status: Current Every Day Smoker    Types: Cigarettes  . Smokeless tobacco: Never Used  . Tobacco comment: 1 pack per week  Substance and Sexual Activity  . Alcohol use: Yes    Alcohol/week: 3.0 - 4.0 standard drinks    Types: 3 - 4 Standard drinks or equivalent per week  . Drug use: Not Currently    Comment: 4 years ago  . Sexual activity: Yes  Lifestyle  . Physical activity:    Days per week: Not on file    Minutes per session: Not on file  . Stress: Not on file  Relationships  . Social connections:    Talks on phone: Not on file    Gets together: Not on file    Attends religious service: Not on file    Active member of club or organization: Not on file    Attends meetings of clubs or organizations: Not on file    Relationship status: Not on file  . Intimate partner violence:    Fear of current or ex partner: Not on file    Emotionally abused: Not on file    Physically abused: Not on file    Forced sexual activity: Not on file  Other Topics Concern  . Not on file  Social History Narrative   Work for [employer] in [place]   1 daughter -- 48 y/o   Not married    Past Surgical History:  Procedure Laterality Date  . FLEXIBLE SIGMOIDOSCOPY  05/08/2011   Reason for exam: hematochezia; Findings: internal and external hemorrhoids  . WISDOM TOOTH EXTRACTION      Family History  Problem Relation Age of Onset  . Hypertension Mother   . Alcohol abuse Mother   . Asthma Mother   . COPD Mother   . Drug abuse Mother   . Heart disease Mother   . Hypertension Sister   . Sleep apnea Sister   . Alcohol abuse Father   . Drug abuse Father   . Alcohol abuse Maternal Grandmother   . Alcohol abuse Maternal Grandfather   . Drug abuse Maternal Grandfather   . Diabetes Maternal Grandfather     PMHx, SurgHx, SocialHx, FamHx, Medications, and Allergies were reviewed in the Visit  Navigator and updated as appropriate.   Patient Active Problem List   Diagnosis Date Noted  . Tobacco abuse 04/19/2018  . Overweight 04/19/2018  . Hypertension 02/19/2018    Social History   Tobacco Use  . Smoking status: Current Every Day Smoker    Types: Cigarettes  . Smokeless tobacco: Never Used  . Tobacco comment: 1 pack per week  Substance Use Topics  . Alcohol use: Yes    Alcohol/week: 3.0 - 4.0 standard drinks    Types: 3 - 4 Standard drinks or equivalent per week  . Drug use: Not Currently    Comment: 4 years ago    Current Medications and Allergies:    Current Outpatient Medications:  .  amLODipine (NORVASC) 5 MG tablet, Take 1 tablet (5 mg total) by mouth daily., Disp: 90 tablet, Rfl: 1 .  atorvastatin (LIPITOR) 20 MG tablet, Take 1 tablet (20 mg total) by mouth daily., Disp: 90 tablet, Rfl: 0 .  CHANTIX STARTING MONTH PAK 0.5 MG X 11 & 1 MG X 42 tablet, USE AS DIRECTED. FOR 28 DAYS, Disp: , Rfl: 0  No Known Allergies  Review of Systems   Review of Systems  Eyes: Negative for blurred vision.  Respiratory: Negative for shortness of breath.   Cardiovascular: Negative for chest pain.  Neurological: Negative for headaches.    Vitals:   Vitals:   07/20/18 1016  BP: 128/90  Pulse: 78  Temp: 97.8 F (36.6 C)  TempSrc: Oral  SpO2: 99%  Weight: 173 lb (78.5 kg)  Height: 5\' 7"  (1.702 m)     Body mass index is 27.1 kg/m.   Physical Exam:    Physical Exam  Constitutional: He appears well-developed. He is cooperative.  Non-toxic appearance. He does not have a sickly appearance. He does not appear ill. No distress.  Cardiovascular: Normal rate, regular rhythm, S1 normal, S2 normal, normal heart sounds and normal pulses.  No LE edema  Pulmonary/Chest: Effort normal and breath sounds normal.  Neurological: He is alert. GCS eye subscore is 4. GCS verbal subscore is 5. GCS motor subscore is 6.  Skin: Skin is warm, dry and intact.  Psychiatric: He has a  normal mood and affect. His speech is normal and behavior is normal.  Nursing note and vitals reviewed.  Results for orders placed or performed in visit on 07/20/18  POCT glycosylated hemoglobin (Hb A1C)  Result Value Ref Range   Hemoglobin A1C 5.5 4.0 - 5.6 %     Assessment and Plan:  Joel [name] was seen today for hypertension.  Diagnoses and all orders for this visit:  Elevated glucose HgbA1c is 5.5 today. Reassured him that he does not have diabetes or pre-diabetes. -     POCT glycosylated hemoglobin (Hb A1C)  Essential hypertension Continue Norvasc 5 mg. Follow-up with [place] in 3 months, sooner if needed. I told him that if his diastolic does not decrease much with use of CPAP, that we will increase Norvasc 5 mg.  Hyperlipidemia, unspecified hyperlipidemia type Current ASCVD is 2.8%. Decrease Lipitor to 20 mg daily. Follow-up in 3 months.  Other orders -     atorvastatin (LIPITOR) 20 MG tablet; Take 1 tablet (20 mg total) by mouth daily.   . Reviewed expectations re: course of current medical issues. . Discussed self-management of symptoms. . Outlined signs and symptoms indicating need for more acute intervention. . Patient verbalized understanding and all questions were answered. . See orders for this visit as documented in the electronic medical record. . Patient received an After Visit Summary.  CMA or LPN served as scribe during this visit. History, Physical, and Plan performed by medical provider. The above documentation has been reviewed and is accurate and complete.   [name], PA-C [place] [place] 07/20/2018  Follow-up: Return in about 3 months (around 10/20/2018) for Blood Pressure F/U.

## 2018-07-27 DIAGNOSIS — G4733 Obstructive sleep apnea (adult) (pediatric): Secondary | ICD-10-CM | POA: Diagnosis not present

## 2018-08-27 DIAGNOSIS — G4733 Obstructive sleep apnea (adult) (pediatric): Secondary | ICD-10-CM | POA: Diagnosis not present

## 2018-09-10 ENCOUNTER — Encounter: Payer: Self-pay | Admitting: Adult Health

## 2018-09-10 ENCOUNTER — Ambulatory Visit: Payer: BLUE CROSS/BLUE SHIELD | Admitting: Adult Health

## 2018-09-10 VITALS — BP 137/93 | HR 96 | Ht 67.0 in | Wt 169.0 lb

## 2018-09-10 DIAGNOSIS — G4733 Obstructive sleep apnea (adult) (pediatric): Secondary | ICD-10-CM

## 2018-09-10 DIAGNOSIS — Z9989 Dependence on other enabling machines and devices: Secondary | ICD-10-CM

## 2018-09-10 NOTE — Patient Instructions (Addendum)
Your Plan:  Continue using CPAP nightly and greater than 4 hours each night I will increase pressure to 5 to 15 cm of water Chin strap ordered If your symptoms worsen or you develop new symptoms please let us know.   Please call [facility] at [phone], and press option 1 when prompted. Their customer service representatives will be glad to assist you. If they are unable to answer, please leave a message and they will call you back. Make sure to leave your name and return phone number.   Thank you for coming to see [facility] at [facility]. I hope we have been able to provide you high quality care today.  You may receive a patient satisfaction survey over the next few weeks. We would appreciate your feedback and comments so that we may continue to improve ourselves and the health of our patients.

## 2018-09-10 NOTE — Progress Notes (Signed)
PATIENT: Joel [name] [name] DOB: Aug 19, 1976  REASON FOR VISIT: follow up HISTORY FROM: patient  HISTORY OF PRESENT ILLNESS: Today 09/10/18:  Joel [name] is a 42 year old male with a history of obstructive sleep apnea on CPAP.  He is download indicates that he uses machine 25 out of 30 days for compliance of 83%.  He uses machine greater than 4 hours and out of 30 days for compliance of 33%.  On average he uses his machine 3 hours and 49 minutes.  His residual AHI is 6.4 on 5 to 12 cm of water with EPR of 3.  His average pressure in the 95th percentile is 11.8.  He does not have a significant leak.  Some nights he reports that he was asleep without the mask on or he will take the mask on during the night.  He states that he is a mouth breather and sometimes he will feel the air leaking from his mouth.  However he does not want a full facemask.  He returns today for evaluation.  HISTORY (Copied from Dr.Dohmeier's note)  Joel [name] is a 42 y.o. AA male patient  and seen here on 06-17-2018 as in a referral from [place] .  I have the pleasure of meeting Joel [name] today, a 42 year old right-handed gentleman who has been told that he snores and quits breathing during his sleep.  His girlfriend further has described that he sometimes gasps or chokes.  She has felt that he struggles for the next breath during his sleep.  In spite of being 76 years young and father slender Joel [name] does have a history of elevated blood pressure.  He reports an average of 130 mmHg systolic over 88 mmHg diastolic and his home readings.  He does not have associated chest pain, dizziness, headaches or blurred vision.  He is taking medication for hypertension.  The report of his girlfriend and his sister's recent diagnosis with sleep apnea now wearing CPAP has made it more urgent for him to be evaluated.   He has also been daytime sleepiness than he likes.     Sleep habits are as  follows: Dinner time is between 8-9 PM, his work is off at 5 PM. Does not commute for long distances. After dinner drinks may include alcohol, and the couple watches TV. The patient goes to bedroom by 11 PM and watches TV in bed, has it running all night. The bedroom is cool with a ceiling fan, but not quiet and dark. Flat bed , sleeps on his stomach, on 2-3 pillows. Going to sleep is not difficult , he can sleep through the night, no nocturia. No nightmares- he does recall dreams.   Wakes up spontaneously at 8 , needs an alarm if he has work to do and set at 6 AM. That's when he is sleepier in daytime. If he naps it will be an hour.   Sleep medical history and family sleep history: sister , aged 57 has just been diagnosed with OSA. HTN affects mother.   Social history: lives with girlfriend, no children, no pets. Drinks alcohol, liquor- and caffeine : mountain dews , dr pepper and colas 2-3 a day/ sweet tea.  Smoker- 1/2 ppd, cigarettes- started smoking at age 54 .   REVIEW OF SYSTEMS: Out of a complete 14 system review of symptoms, the patient complains only of the following symptoms, and all other reviewed systems are negative.  Epworth sleep score 9, fatigue severity score 16  ALLERGIES: No Known Allergies  HOME MEDICATIONS: Outpatient Medications Prior to Visit  Medication Sig Dispense Refill  . amLODipine (NORVASC) 5 MG tablet Take 1 tablet (5 mg total) by mouth daily. 90 tablet 1  . atorvastatin (LIPITOR) 20 MG tablet Take 1 tablet (20 mg total) by mouth daily. 90 tablet 0  . CHANTIX STARTING MONTH PAK 0.5 MG X 11 & 1 MG X 42 tablet USE AS DIRECTED. FOR 28 DAYS  0   No facility-administered medications prior to visit.     PAST MEDICAL HISTORY: Past Medical History:  Diagnosis Date  . Hyperlipemia   . Hypertension 2005   stopped meds in 2009    PAST SURGICAL HISTORY: Past Surgical History:  Procedure Laterality Date  . FLEXIBLE SIGMOIDOSCOPY  05/08/2011   Reason for  exam: hematochezia; Findings: internal and external hemorrhoids  . WISDOM TOOTH EXTRACTION      FAMILY HISTORY: Family History  Problem Relation Age of Onset  . Hypertension Mother   . Alcohol abuse Mother   . Asthma Mother   . COPD Mother   . Drug abuse Mother   . Heart disease Mother   . Hypertension Sister   . Sleep apnea Sister   . Alcohol abuse Father   . Drug abuse Father   . Alcohol abuse Maternal Grandmother   . Alcohol abuse Maternal Grandfather   . Drug abuse Maternal Grandfather   . Diabetes Maternal Grandfather     SOCIAL HISTORY: Social History   Socioeconomic History  . Marital status: Single    Spouse name: Not on file  . Number of children: Not on file  . Years of education: Not on file  . Highest education level: Not on file  Occupational History  . Not on file  Social Needs  . Financial resource strain: Not on file  . Food insecurity:    Worry: Not on file    Inability: Not on file  . Transportation needs:    Medical: Not on file    Non-medical: Not on file  Tobacco Use  . Smoking status: Current Every Day Smoker    Types: Cigarettes  . Smokeless tobacco: Never Used  . Tobacco comment: 1 pack per week  Substance and Sexual Activity  . Alcohol use: Yes    Alcohol/week: 3.0 - 4.0 standard drinks    Types: 3 - 4 Standard drinks or equivalent per week  . Drug use: Not Currently    Comment: 4 years ago  . Sexual activity: Yes  Lifestyle  . Physical activity:    Days per week: Not on file    Minutes per session: Not on file  . Stress: Not on file  Relationships  . Social connections:    Talks on phone: Not on file    Gets together: Not on file    Attends religious service: Not on file    Active member of club or organization: Not on file    Attends meetings of clubs or organizations: Not on file    Relationship status: Not on file  . Intimate partner violence:    Fear of current or ex partner: Not on file    Emotionally abused: Not on  file    Physically abused: Not on file    Forced sexual activity: Not on file  Other Topics Concern  . Not on file  Social History Narrative   [place] for [employer] in [place]   1 daughter -- 44 y/o   Not married  PHYSICAL EXAM  Vitals:   09/10/18 0939  BP: (!) 137/93  Pulse: 96  Weight: 169 lb (76.7 kg)  Height: 5\' 7"  (1.702 m)   Body mass index is 26.47 kg/m.  Generalized: Well developed, in no acute distress   Neurological examination  Mentation: Alert oriented to time, place, history taking. Follows all commands speech and language fluent Cranial nerve II-XII:Extraocular movements were full, visual field were full on confrontational test. Facial sensation and strength were normal. Uvula tongue midline. Head turning and shoulder shrug  were normal and symmetric.  Neck circumference 16.5 inches, Mallampati 4+ Motor: The motor testing reveals 5 over 5 strength of all 4 extremities. Good symmetric motor tone is noted throughout.  Sensory: Sensory testing is intact to soft touch on all 4 extremities. No evidence of extinction is noted.  Coordination: Cerebellar testing reveals good finger-nose-finger and heel-to-shin bilaterally.  Gait and station: Gait is normal.   DIAGNOSTIC DATA (LABS, IMAGING, TESTING) - I reviewed patient records, labs, notes, testing and imaging myself where available.  Lab Results  Component Value Date   WBC 5.1 07/07/2018   HGB 13.9 07/07/2018   HCT 41 07/07/2018   MCV 94.0 02/19/2018   PLT 214 07/07/2018      Component Value Date/Time   NA 136 (A) 07/07/2018   K 3.4 07/07/2018   CL 103 02/19/2018 1221   CO2 31 02/19/2018 1221   GLUCOSE 93 02/19/2018 1221   BUN 13 07/07/2018   CREATININE 1.3 07/07/2018   CREATININE 1.04 02/19/2018 1221   CALCIUM 9.5 02/19/2018 1221   PROT 7.2 02/19/2018 1221   ALBUMIN 4.1 02/19/2018 1221   AST 32 07/07/2018   ALT 41 (A) 07/07/2018   ALKPHOS 106 07/07/2018   BILITOT 0.5 02/19/2018 1221   Lab  Results  Component Value Date   CHOL 129 07/07/2018   HDL 43 07/07/2018   LDLCALC 65 07/07/2018   TRIG 106 07/07/2018   CHOLHDL 4 04/19/2018   Lab Results  Component Value Date   HGBA1C 5.5 07/20/2018   No results found for: VITAMINB12 Lab Results  Component Value Date   TSH 1.52 07/07/2018      ASSESSMENT AND PLAN 42 y.o. year old male  has a past medical history of Hyperlipemia and Hypertension (2005). here with:  1.  Obstructive sleep apnea on CPAP  The patient CPAP download shows suboptimal compliance.  He is encouraged to use his CPAP machine nightly and greater than 4 hours each night.  I will increase his pressure to 5 to 15 cm of water.  I will also order a chinstrap.  He is advised that if his symptoms worsen or he develop new symptoms he should let us know.  He will return in 6 months or sooner if needed.   I spent 15 minutes with the patient. 50% of this time was spent reviewing CPAP download   Butch Penny, MSN, NP-C 09/10/2018, 9:36 AM [facility] [address] [place], [place] [address] [phone]

## 2018-09-26 DIAGNOSIS — G4733 Obstructive sleep apnea (adult) (pediatric): Secondary | ICD-10-CM | POA: Diagnosis not present

## 2018-10-22 ENCOUNTER — Encounter: Payer: Self-pay | Admitting: Physician Assistant

## 2018-10-22 ENCOUNTER — Ambulatory Visit (INDEPENDENT_AMBULATORY_CARE_PROVIDER_SITE_OTHER): Payer: 59 | Admitting: Physician Assistant

## 2018-10-22 VITALS — BP 126/90 | HR 101 | Temp 98.3°F | Ht 67.0 in | Wt 168.0 lb

## 2018-10-22 DIAGNOSIS — Z720 Tobacco use: Secondary | ICD-10-CM

## 2018-10-22 DIAGNOSIS — I10 Essential (primary) hypertension: Secondary | ICD-10-CM

## 2018-10-22 MED ORDER — AMLODIPINE BESYLATE 5 MG PO TABS: 5.0000 mg | ORAL_TABLET | Freq: Every day | ORAL | 1 refills | Status: DC

## 2018-10-22 MED ORDER — VARENICLINE TARTRATE 1 MG PO TABS: 1.0000 mg | ORAL_TABLET | Freq: Two times a day (BID) | ORAL | 0 refills | Status: DC

## 2018-10-22 NOTE — Progress Notes (Signed)
Joel [name] is a 43 y.o. male is here to discuss: Hypertension  I acted as a Neurosurgeon for [employer], PA-C Joel Mull, LPN  History of Present Illness:   Chief Complaint  Patient presents with  . Hypertension    Hypertension  This is a chronic problem. Episode onset: Pt here today for 3 month follow up. Pt checking blood pressure every 2 days. Bp ranging Systolic low 130's and Diastolic is the mid 80's. The problem has been gradually improving since onset. The problem is controlled. Pertinent negatives include no blurred vision, chest pain, headaches, palpitations, peripheral edema or shortness of breath. Risk factors for coronary artery disease include male gender, obesity, smoking/tobacco exposure and dyslipidemia. Past treatments include ACE inhibitors. The current treatment provides moderate improvement. There are no compliance problems.  Identifiable causes of hypertension include sleep apnea.   Smoking Cessation Continues on Chantix. He is doing well overall on this. He is down to about 4 cigarettes daily.  There are no preventive care reminders to display for this patient.  Past Medical History:  Diagnosis Date  . Hyperlipemia   . Hypertension 2005   stopped meds in 2009     Social History   Socioeconomic History  . Marital status: Single    Spouse name: Not on file  . Number of children: Not on file  . Years of education: Not on file  . Highest education level: Not on file  Occupational History  . Not on file  Social Needs  . Financial resource strain: Not on file  . Food insecurity:    Worry: Not on file    Inability: Not on file  . Transportation needs:    Medical: Not on file    Non-medical: Not on file  Tobacco Use  . Smoking status: Current Every Day Smoker    Types: Cigarettes  . Smokeless tobacco: Never Used  . Tobacco comment: Pt started Chantix a month ago, down to smoking 4 cigaretters a  day  Substance and Sexual Activity  .  Alcohol use: Yes    Alcohol/week: 3.0 - 4.0 standard drinks    Types: 3 - 4 Standard drinks or equivalent per week  . Drug use: Not Currently    Comment: 4 years ago  . Sexual activity: Yes  Lifestyle  . Physical activity:    Days per week: Not on file    Minutes per session: Not on file  . Stress: Not on file  Relationships  . Social connections:    Talks on phone: Not on file    Gets together: Not on file    Attends religious service: Not on file    Active member of club or organization: Not on file    Attends meetings of clubs or organizations: Not on file    Relationship status: Not on file  . Intimate partner violence:    Fear of current or ex partner: Not on file    Emotionally abused: Not on file    Physically abused: Not on file    Forced sexual activity: Not on file  Other Topics Concern  . Not on file  Social History Narrative   Work for [employer] in [place]   1 daughter -- 48 y/o   Not married    Past Surgical History:  Procedure Laterality Date  . FLEXIBLE SIGMOIDOSCOPY  05/08/2011   Reason for exam: hematochezia; Findings: internal and external hemorrhoids  . WISDOM TOOTH EXTRACTION      Family  History  Problem Relation Age of Onset  . Hypertension Mother   . Alcohol abuse Mother   . Asthma Mother   . COPD Mother   . Drug abuse Mother   . Heart disease Mother   . Hypertension Sister   . Sleep apnea Sister   . Alcohol abuse Father   . Drug abuse Father   . Alcohol abuse Maternal Grandmother   . Alcohol abuse Maternal Grandfather   . Drug abuse Maternal Grandfather   . Diabetes Maternal Grandfather     PMHx, SurgHx, SocialHx, FamHx, Medications, and Allergies were reviewed in the Visit Navigator and updated as appropriate.   Patient Active Problem List   Diagnosis Date Noted  . Tobacco abuse 04/19/2018  . Overweight 04/19/2018  . Hypertension 02/19/2018    Social History   Tobacco Use  . Smoking status: Current Every Day Smoker    Types:  Cigarettes  . Smokeless tobacco: Never Used  . Tobacco comment: Pt started Chantix a month ago, down to smoking 4 cigaretters a  day  Substance Use Topics  . Alcohol use: Yes    Alcohol/week: 3.0 - 4.0 standard drinks    Types: 3 - 4 Standard drinks or equivalent per week  . Drug use: Not Currently    Comment: 4 years ago    Current Medications and Allergies:    Current Outpatient Medications:  .  amLODipine (NORVASC) 5 MG tablet, Take 1 tablet (5 mg total) by mouth daily., Disp: 90 tablet, Rfl: 1 .  atorvastatin (LIPITOR) 20 MG tablet, Take 1 tablet (20 mg total) by mouth daily., Disp: 90 tablet, Rfl: 0 .  varenicline (CHANTIX CONTINUING MONTH PAK) 1 MG tablet, Take 1 tablet (1 mg total) by mouth 2 (two) times daily., Disp: 60 tablet, Rfl: 0  No Known Allergies  Review of Systems   Review of Systems  Eyes: Negative for blurred vision.  Respiratory: Negative for shortness of breath.   Cardiovascular: Negative for chest pain and palpitations.  Neurological: Negative for headaches.    Vitals:   Vitals:   10/22/18 1044  BP: 126/90  Pulse: (!) 101  Temp: 98.3 F (36.8 C)  TempSrc: Oral  SpO2: 96%  Weight: 168 lb (76.2 kg)  Height: 5\' 7"  (1.702 m)     Body mass index is 26.31 kg/m.   Physical Exam:    Physical Exam Vitals signs and nursing note reviewed.  Constitutional:      General: He is not in acute distress.    Appearance: He is well-developed. He is not ill-appearing or toxic-appearing.  Cardiovascular:     Rate and Rhythm: Normal rate and regular rhythm.     Pulses: Normal pulses.     Heart sounds: Normal heart sounds, S1 normal and S2 normal.     Comments: No LE edema Pulmonary:     Effort: Pulmonary effort is normal.     Breath sounds: Normal breath sounds.  Skin:    General: Skin is warm and dry.  Neurological:     Mental Status: He is alert.     GCS: GCS eye subscore is 4. GCS verbal subscore is 5. GCS motor subscore is 6.  Psychiatric:         Speech: Speech normal.        Behavior: Behavior normal. Behavior is cooperative.      Assessment and Plan:    Joel [name] was seen today for hypertension.  Diagnoses and all orders for this visit:  Essential hypertension  Overall well controlled. Continue Norvasc 5 mg daily. Continue CPAP -- fairly compliant with this. Continue smoking cessation. Follow-up in 3-6 months, sooner if symptoms develop. Re-check lipid panel at follow-up.  Tobacco abuse Refilled chantix. Continue to encourage cessation.  Other orders -     amLODipine (NORVASC) 5 MG tablet; Take 1 tablet (5 mg total) by mouth daily. -     varenicline (CHANTIX CONTINUING MONTH PAK) 1 MG tablet; Take 1 tablet (1 mg total) by mouth 2 (two) times daily.    . Reviewed expectations re: course of current medical issues. . Discussed self-management of symptoms. . Outlined signs and symptoms indicating need for more acute intervention. . Patient verbalized understanding and all questions were answered. . See orders for this visit as documented in the electronic medical record. . Patient received an After Visit Summary.  CMA or LPN served as scribe during this visit. History, Physical, and Plan performed by medical provider. The above documentation has been reviewed and is accurate and complete.   [name], PA-C [place] [place] 10/22/2018  Follow-up: No follow-ups on file.

## 2018-10-22 NOTE — Patient Instructions (Signed)
It was great to see you!  Continue Chantix. Continue Norvasc 5 mg.  Let's follow-up in 3-6 months, sooner if you have concerns.  Take care,  [name] PA-C

## 2018-10-27 DIAGNOSIS — G4733 Obstructive sleep apnea (adult) (pediatric): Secondary | ICD-10-CM | POA: Diagnosis not present

## 2018-10-29 ENCOUNTER — Other Ambulatory Visit (HOSPITAL_COMMUNITY)
Admission: RE | Admit: 2018-10-29 | Discharge: 2018-10-29 | Disposition: A | Discharge status: Home / Self Care | Payer: 59 | Source: Ambulatory Visit | Attending: Physician Assistant | Admitting: Physician Assistant

## 2018-10-29 ENCOUNTER — Ambulatory Visit: Payer: 59 | Admitting: Physician Assistant

## 2018-10-29 ENCOUNTER — Encounter: Payer: Self-pay | Admitting: Physician Assistant

## 2018-10-29 VITALS — BP 110/80 | HR 82 | Temp 98.0°F | Ht 67.0 in | Wt 172.5 lb

## 2018-10-29 DIAGNOSIS — Z202 Contact with and (suspected) exposure to infections with a predominantly sexual mode of transmission: Secondary | ICD-10-CM | POA: Insufficient documentation

## 2018-10-29 NOTE — Patient Instructions (Signed)
It was great to see you!  We will call you with your results.  Take care,  [name] PA-C

## 2018-10-29 NOTE — Progress Notes (Signed)
Joel [name] is a 43 y.o. male here for a new problem.  I acted as a Neurosurgeon for [employer], PA-C Joel Mull, LPN  History of Present Illness:   Chief Complaint  Patient presents with  . STD Testing    HPI  STD Testing Pt here today for STD testing, girlfriend diagnosed with bacterial infection BV was put on antibiotics Flagyl and was told her partner should be checked. Pt denies fever, chills, no urinary symptoms, no penile discharge, and no pain with intercourse.  He has had an STD in the past, he thinks that he may have had chlamydia, he is unsure.  Past Medical History:  Diagnosis Date  . Hyperlipemia   . Hypertension 2005   stopped meds in 2009     Social History   Socioeconomic History  . Marital status: Single    Spouse name: Not on file  . Number of children: Not on file  . Years of education: Not on file  . Highest education level: Not on file  Occupational History  . Not on file  Social Needs  . Financial resource strain: Not on file  . Food insecurity:    Worry: Not on file    Inability: Not on file  . Transportation needs:    Medical: Not on file    Non-medical: Not on file  Tobacco Use  . Smoking status: Current Every Day Smoker    Types: Cigarettes  . Smokeless tobacco: Never Used  . Tobacco comment: Pt started Chantix a month ago, down to smoking 4 cigaretters a  day  Substance and Sexual Activity  . Alcohol use: Yes    Alcohol/week: 3.0 - 4.0 standard drinks    Types: 3 - 4 Standard drinks or equivalent per week  . Drug use: Not Currently    Comment: 4 years ago  . Sexual activity: Yes  Lifestyle  . Physical activity:    Days per week: Not on file    Minutes per session: Not on file  . Stress: Not on file  Relationships  . Social connections:    Talks on phone: Not on file    Gets together: Not on file    Attends religious service: Not on file    Active member of club or organization: Not on file    Attends meetings of  clubs or organizations: Not on file    Relationship status: Not on file  . Intimate partner violence:    Fear of current or ex partner: Not on file    Emotionally abused: Not on file    Physically abused: Not on file    Forced sexual activity: Not on file  Other Topics Concern  . Not on file  Social History Narrative   Work for [employer] in [place]   1 daughter -- 17 y/o   Not married    Past Surgical History:  Procedure Laterality Date  . FLEXIBLE SIGMOIDOSCOPY  05/08/2011   Reason for exam: hematochezia; Findings: internal and external hemorrhoids  . WISDOM TOOTH EXTRACTION      Family History  Problem Relation Age of Onset  . Hypertension Mother   . Alcohol abuse Mother   . Asthma Mother   . COPD Mother   . Drug abuse Mother   . Heart disease Mother   . Hypertension Sister   . Sleep apnea Sister   . Alcohol abuse Father   . Drug abuse Father   . Alcohol abuse Maternal Grandmother   .  Alcohol abuse Maternal Grandfather   . Drug abuse Maternal Grandfather   . Diabetes Maternal Grandfather     No Known Allergies  Current Medications:   Current Outpatient Medications:  .  amLODipine (NORVASC) 5 MG tablet, Take 1 tablet (5 mg total) by mouth daily., Disp: 90 tablet, Rfl: 1 .  atorvastatin (LIPITOR) 20 MG tablet, Take 1 tablet (20 mg total) by mouth daily., Disp: 90 tablet, Rfl: 0 .  varenicline (CHANTIX CONTINUING MONTH PAK) 1 MG tablet, Take 1 tablet (1 mg total) by mouth 2 (two) times daily., Disp: 60 tablet, Rfl: 0   Review of Systems:   ROS  Negative unless otherwise specified per HPI.  Vitals:   Vitals:   10/29/18 0900  BP: 110/80  Pulse: 82  Temp: 98 F (36.7 C)  TempSrc: Oral  SpO2: 99%  Weight: 172 lb 8 oz (78.2 kg)  Height: 5\' 7"  (1.702 m)     Body mass index is 27.02 kg/m.  Physical Exam:   Physical Exam Vitals signs and nursing note reviewed.  Constitutional:      General: He is not in acute distress.    Appearance: He is  well-developed. He is not ill-appearing or toxic-appearing.  Cardiovascular:     Rate and Rhythm: Normal rate and regular rhythm.     Pulses: Normal pulses.     Heart sounds: Normal heart sounds, S1 normal and S2 normal.     Comments: No LE edema Pulmonary:     Effort: Pulmonary effort is normal.     Breath sounds: Normal breath sounds.  Skin:    General: Skin is warm and dry.  Neurological:     Mental Status: He is alert.     GCS: GCS eye subscore is 4. GCS verbal subscore is 5. GCS motor subscore is 6.  Psychiatric:        Speech: Speech normal.        Behavior: Behavior normal. Behavior is cooperative.     Assessment and Plan:   Joel [name] was seen today for std testing.  Diagnoses and all orders for this visit:  STD exposure -     HIV Antibody (routine testing w rflx) -     RPR -     Urine cytology ancillary only   Discussed with patient that we typically do not treat for BV in male partners. Discussed avoiding sexual activity until partner completes treatment of antibiotics. We will be in touch regarding labs and urine results.  . Reviewed expectations re: course of current medical issues. . Discussed self-management of symptoms. . Outlined signs and symptoms indicating need for more acute intervention. . Patient verbalized understanding and all questions were answered. . See orders for this visit as documented in the electronic medical record. . Patient received an After-Visit Summary.  CMA or LPN served as scribe during this visit. History, Physical, and Plan performed by medical provider. The above documentation has been reviewed and is accurate and complete.  Joel Motto, PA-C

## 2018-11-01 LAB — RPR: RPR: NONREACTIVE

## 2018-11-01 LAB — URINE CYTOLOGY ANCILLARY ONLY
Chlamydia: NEGATIVE
Neisseria Gonorrhea: NEGATIVE
TRICH (WINDOWPATH): NEGATIVE

## 2018-11-01 LAB — HIV ANTIBODY (ROUTINE TESTING W REFLEX): HIV 1&2 Ab, 4th Generation: NONREACTIVE

## 2018-11-10 ENCOUNTER — Other Ambulatory Visit: Payer: Self-pay | Admitting: *Deleted

## 2018-11-10 MED ORDER — ATORVASTATIN CALCIUM 20 MG PO TABS: 20.0000 mg | ORAL_TABLET | Freq: Every day | ORAL | 1 refills | Status: DC

## 2018-11-27 DIAGNOSIS — G4733 Obstructive sleep apnea (adult) (pediatric): Secondary | ICD-10-CM | POA: Diagnosis not present

## 2018-12-02 ENCOUNTER — Ambulatory Visit (HOSPITAL_COMMUNITY)
Admission: EM | Admit: 2018-12-02 | Discharge: 2018-12-02 | Disposition: A | Discharge status: Home / Self Care | Payer: 59 | Attending: Family Medicine | Admitting: Family Medicine

## 2018-12-02 ENCOUNTER — Encounter (HOSPITAL_COMMUNITY): Payer: Self-pay | Admitting: Emergency Medicine

## 2018-12-02 DIAGNOSIS — B353 Tinea pedis: Secondary | ICD-10-CM

## 2018-12-02 MED ORDER — KETOCONAZOLE 200 MG PO TABS: 200.0000 mg | ORAL_TABLET | Freq: Every day | ORAL | 2 refills | Status: DC

## 2018-12-02 MED ORDER — KETOCONAZOLE 2 % EX CREA: 1.0000 "application " | TOPICAL_CREAM | Freq: Two times a day (BID) | CUTANEOUS | 4 refills | Status: DC

## 2018-12-02 NOTE — ED Provider Notes (Signed)
[facility]    CSN: [id number] Arrival date & time: 12/02/18  1755     History   Chief Complaint Chief Complaint  Patient presents with  . Foot Pain    HPI Philis Nettlentonio R Mathew is a 43 y.o. male.   Pt c/o pain inbetween the toes of his R foot between lateral two toes for a week. States it feels like there is a sore there. Denies injury.   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  Wears steel toed boots     Past Medical History:  Diagnosis Date  . Hyperlipemia   . Hypertension 2005   stopped meds in 2009    Patient Active Problem List   Diagnosis Date Noted  . Tobacco abuse 04/19/2018  . Overweight 04/19/2018  . Hypertension 02/19/2018    Past Surgical History:  Procedure Laterality Date  . FLEXIBLE SIGMOIDOSCOPY  05/08/2011   Reason for exam: hematochezia; Findings: internal and external hemorrhoids  . WISDOM TOOTH EXTRACTION         Home Medications    Prior to Admission medications   Medication Sig Start Date End Date Taking? Authorizing Provider  amLODipine (NORVASC) 5 MG tablet Take 1 tablet (5 mg total) by mouth daily. 10/22/18   Jarold MottoWorley, Samantha, PA  atorvastatin (LIPITOR) 20 MG tablet Take 1 tablet (20 mg total) by mouth daily. 11/10/18   Jarold MottoWorley, Samantha, PA  ketoconazole (NIZORAL) 2 % cream Apply 1 application topically 2 (two) times daily. 12/02/18   Elvina SidleLauenstein, Rudra Hobbins, MD  ketoconazole (NIZORAL) 200 MG tablet Take 1 tablet (200 mg total) by mouth daily. 12/02/18   Elvina SidleLauenstein, Berkeley Veldman, MD  varenicline (CHANTIX CONTINUING MONTH PAK) 1 MG tablet Take 1 tablet (1 mg total) by mouth 2 (two) times daily. 10/22/18   Jarold MottoWorley, Samantha, PA    Family History Family History  Problem Relation Age of Onset  . Hypertension Mother   . Alcohol abuse Mother   . Asthma Mother   . COPD Mother   . Drug abuse Mother   . Heart disease Mother   . Hypertension Sister   . Sleep apnea Sister   . Alcohol abuse Father   . Drug abuse Father   . Alcohol abuse Maternal  Grandmother   . Alcohol abuse Maternal Grandfather   . Drug abuse Maternal Grandfather   . Diabetes Maternal Grandfather     Social History Social History   Tobacco Use  . Smoking status: Current Every Day Smoker    Types: Cigarettes  . Smokeless tobacco: Never Used  . Tobacco comment: Pt started Chantix a month ago, down to smoking 4 cigaretters a  day  Substance Use Topics  . Alcohol use: Yes    Alcohol/week: 3.0 - 4.0 standard drinks    Types: 3 - 4 Standard drinks or equivalent per week  . Drug use: Not Currently    Comment: 4 years ago     Allergies   Patient has no known allergies.   Review of Systems Review of Systems   Physical Exam Triage Vital Signs ED Triage Vitals  Enc Vitals Group     BP 12/02/18 1829 126/83     Pulse Rate 12/02/18 1829 97     Resp 12/02/18 1829 18     Temp 12/02/18 1829 99.2 F (37.3 C)     Temp src --      SpO2 12/02/18 1829 100 %     Weight --      Height --  Head Circumference --      Peak Flow --      Pain Score 12/02/18 1830 5     Pain Loc --      Pain Edu? --      Excl. in GC? --    No data found.  Updated Vital Signs BP 126/83   Pulse 97   Temp 99.2 F (37.3 C)   Resp 18   SpO2 100%    Physical Exam Vitals signs and nursing note reviewed.  Constitutional:      Appearance: Normal appearance. He is normal weight.  HENT:     Head: Normocephalic.  Pulmonary:     Effort: Pulmonary effort is normal.  Musculoskeletal: Normal range of motion.        General: No signs of injury.  Skin:    General: Skin is warm.     Comments: White eschar between lateral two toes of right foot with adjacent dorsal erythema  Neurological:     General: No focal deficit present.     Mental Status: He is alert.  Psychiatric:        Mood and Affect: Mood normal.      UC Treatments / Results  Labs (all labs ordered are listed, but only abnormal results are displayed) Labs Reviewed - No data to  display  EKG None  Radiology No results found.  Procedures Procedures (including critical care time)  Medications Ordered in UC Medications - No data to display  Initial Impression / Assessment and Plan / UC Course  I have reviewed the triage vital signs and the nursing notes.  Pertinent labs & imaging results that were available during my care of the patient were reviewed by me and considered in my medical decision making (see chart for details).    Final Clinical Impressions(s) / UC Diagnoses   Final diagnoses:  Athlete's foot on right   Discharge Instructions   None    ED Prescriptions    Medication Sig Dispense Auth. Provider   ketoconazole (NIZORAL) 2 % cream Apply 1 application topically 2 (two) times daily. 30 g Elvina SidleLauenstein, Markice Torbert, MD   ketoconazole (NIZORAL) 200 MG tablet Take 1 tablet (200 mg total) by mouth daily. 14 tablet Elvina SidleLauenstein, Sandeep Radell, MD     Controlled Substance Prescriptions [place] Controlled Substance Registry consulted? Not Applicable   Elvina SidleLauenstein, Malasha Kleppe, MD 12/02/18 1911

## 2018-12-02 NOTE — ED Triage Notes (Signed)
Pt c/o pain inbetween the toes of his R foot. States it feels like there is a sore there. Denies injury.

## 2018-12-23 DIAGNOSIS — G4733 Obstructive sleep apnea (adult) (pediatric): Secondary | ICD-10-CM | POA: Diagnosis not present

## 2018-12-26 DIAGNOSIS — G4733 Obstructive sleep apnea (adult) (pediatric): Secondary | ICD-10-CM | POA: Diagnosis not present

## 2019-01-26 DIAGNOSIS — G4733 Obstructive sleep apnea (adult) (pediatric): Secondary | ICD-10-CM | POA: Diagnosis not present

## 2019-02-08 ENCOUNTER — Other Ambulatory Visit: Payer: Self-pay | Admitting: Physician Assistant

## 2019-02-09 DIAGNOSIS — G4733 Obstructive sleep apnea (adult) (pediatric): Secondary | ICD-10-CM | POA: Diagnosis not present

## 2019-04-13 ENCOUNTER — Telehealth: Payer: Self-pay | Admitting: *Deleted

## 2019-04-13 ENCOUNTER — Other Ambulatory Visit: Payer: Self-pay | Admitting: *Deleted

## 2019-04-13 MED ORDER — AMLODIPINE BESYLATE 5 MG PO TABS: 5.0000 mg | ORAL_TABLET | Freq: Every day | ORAL | 1 refills | Status: DC

## 2019-04-13 NOTE — Telephone Encounter (Signed)
Due to current COVID 19 pandemic, our office is severely reducing in office visits until further notice, in order to minimize the risk to our patients and healthcare providers. Unable to get in contact with the patient to convert their office visit with [facility] on 04/14/2019 into a doxy.me visit. I left a voicemail asking the patient to return my call. Office number was provided.     If patient calls back please convert their office visit into a doxy.me visit.

## 2019-04-14 ENCOUNTER — Ambulatory Visit: Payer: BLUE CROSS/BLUE SHIELD | Admitting: Adult Health

## 2019-04-29 ENCOUNTER — Ambulatory Visit (INDEPENDENT_AMBULATORY_CARE_PROVIDER_SITE_OTHER): Payer: 59 | Admitting: Physician Assistant

## 2019-04-29 ENCOUNTER — Encounter: Payer: Self-pay | Admitting: Physician Assistant

## 2019-04-29 ENCOUNTER — Other Ambulatory Visit: Payer: Self-pay

## 2019-04-29 VITALS — BP 123/90 | Temp 98.6°F | Wt 167.0 lb

## 2019-04-29 DIAGNOSIS — E785 Hyperlipidemia, unspecified: Secondary | ICD-10-CM | POA: Diagnosis not present

## 2019-04-29 DIAGNOSIS — Z720 Tobacco use: Secondary | ICD-10-CM

## 2019-04-29 DIAGNOSIS — I10 Essential (primary) hypertension: Secondary | ICD-10-CM

## 2019-04-29 NOTE — Progress Notes (Signed)
Virtual Visit via Video   I connected with Joel [name] on 04/29/19 at 11:00 AM EDT by a video enabled telemedicine application and verified that I am speaking with the correct person using two identifiers. Location patient: Home Location provider: [facility], Office Persons participating in the virtual visit: [name], [place] Starlynn Klinkner PA-C  I discussed the limitations of evaluation and management by telemedicine and the availability of in person appointments. The patient expressed understanding and agreed to proceed.  Subjective:   HPI:   HTN Currently taking Amlodopine 5 mg. At home blood pressure readings are: 123/90. Patient denies chest pain, SOB, blurred vision, dizziness, unusual headaches, lower leg swelling. Patient is compliant with medication. Denies excessive caffeine intake, stimulant usage, excessive alcohol intake, or increase in salt consumption.  Tobacco abuse He quit smoking!! Tomorrow is his 1 month anniversary. Didn't use Chantix. Has been smoking for 30 years and is very proud of himself.  ROS: See pertinent positives and negatives per HPI.  Patient Active Problem List   Diagnosis Date Noted  . Tobacco abuse 04/19/2018  . Overweight 04/19/2018  . Hypertension 02/19/2018    Social History   Tobacco Use  . Smoking status: Current Every Day Smoker    Types: Cigarettes  . Smokeless tobacco: Never Used  . Tobacco comment: Pt started Chantix a month ago, down to smoking 4 cigaretters a  day  Substance Use Topics  . Alcohol use: Yes    Alcohol/week: 3.0 - 4.0 standard drinks    Types: 3 - 4 Standard drinks or equivalent per week    Current Outpatient Medications:  .  amLODipine (NORVASC) 5 MG tablet, Take 1 tablet (5 mg total) by mouth daily., Disp: 90 tablet, Rfl: 1 .  atorvastatin (LIPITOR) 20 MG tablet, Take 1 tablet (20 mg total) by mouth daily., Disp: 90 tablet, Rfl: 1 .  ketoconazole (NIZORAL) 2 % cream, Apply 1 application  topically 2 (two) times daily., Disp: 30 g, Rfl: 4 .  ketoconazole (NIZORAL) 200 MG tablet, Take 1 tablet (200 mg total) by mouth daily., Disp: 14 tablet, Rfl: 2  No Known Allergies  Objective:   VITALS: Per patient if applicable, see vitals. GENERAL: Alert, appears well and in no acute distress. HEENT: Atraumatic, conjunctiva clear, no obvious abnormalities on inspection of external nose and ears. NECK: Normal movements of the head and neck. CARDIOPULMONARY: No increased WOB. Speaking in clear sentences. I:E ratio WNL.  MS: Moves all visible extremities without noticeable abnormality. PSYCH: Pleasant and cooperative, well-groomed. Speech normal rate and rhythm. Affect is appropriate. Insight and judgement are appropriate. Attention is focused, linear, and appropriate.  NEURO: CN grossly intact. Oriented as arrived to appointment on time with no prompting. Moves both UE equally.  SKIN: No obvious lesions, wounds, erythema, or cyanosis noted on face or hands.  Assessment and Plan:   Izaih was seen today for hypertension.  Diagnoses and all orders for this visit:  Essential hypertension Well controlled. Continue Norvasc 5 mg daily. Follow-up in 6 months, sooner if needed.  Tobacco abuse Has successfully quit x 1 month. Commended success!   . Reviewed expectations re: course of current medical issues. . Discussed self-management of symptoms. . Outlined signs and symptoms indicating need for more acute intervention. . Patient verbalized understanding and all questions were answered. Marland Kitchen. Health Maintenance issues including appropriate healthy diet, exercise, and smoking avoidance were discussed with patient. . See orders for this visit as documented in the electronic medical record.  I discussed the assessment and treatment plan with the patient. The patient was provided an opportunity to ask questions and all were answered. The patient agreed with the plan and demonstrated an  understanding of the instructions.   The patient was advised to call back or seek an in-person evaluation if the symptoms worsen or if the condition fails to improve as anticipated.   [place], [place] 04/29/2019

## 2019-05-17 ENCOUNTER — Other Ambulatory Visit: Payer: Self-pay | Admitting: *Deleted

## 2019-05-17 MED ORDER — ATORVASTATIN CALCIUM 20 MG PO TABS: 20.0000 mg | ORAL_TABLET | Freq: Every day | ORAL | 0 refills | Status: DC

## 2019-06-29 ENCOUNTER — Other Ambulatory Visit: Payer: Self-pay

## 2019-06-29 DIAGNOSIS — Z20822 Contact with and (suspected) exposure to covid-19: Secondary | ICD-10-CM

## 2019-06-30 LAB — NOVEL CORONAVIRUS, NAA: SARS-CoV-2, NAA: NOT DETECTED

## 2019-07-22 ENCOUNTER — Other Ambulatory Visit: Payer: Self-pay

## 2019-07-22 DIAGNOSIS — Z20822 Contact with and (suspected) exposure to covid-19: Secondary | ICD-10-CM

## 2019-07-23 LAB — NOVEL CORONAVIRUS, NAA: SARS-CoV-2, NAA: NOT DETECTED

## 2019-07-29 ENCOUNTER — Other Ambulatory Visit: Payer: Self-pay | Admitting: Emergency Medicine

## 2019-07-29 DIAGNOSIS — Z20822 Contact with and (suspected) exposure to covid-19: Secondary | ICD-10-CM

## 2019-07-30 LAB — NOVEL CORONAVIRUS, NAA: SARS-CoV-2, NAA: NOT DETECTED

## 2019-08-19 ENCOUNTER — Other Ambulatory Visit: Payer: Self-pay

## 2019-08-19 DIAGNOSIS — Z20822 Contact with and (suspected) exposure to covid-19: Secondary | ICD-10-CM

## 2019-08-20 LAB — NOVEL CORONAVIRUS, NAA: SARS-CoV-2, NAA: NOT DETECTED

## 2019-09-27 ENCOUNTER — Other Ambulatory Visit: Payer: Self-pay

## 2019-09-28 ENCOUNTER — Other Ambulatory Visit (HOSPITAL_COMMUNITY)
Admission: RE | Admit: 2019-09-28 | Discharge: 2019-09-28 | Disposition: A | Discharge status: Home / Self Care | Payer: 59 | Source: Ambulatory Visit | Attending: Physician Assistant | Admitting: Physician Assistant

## 2019-09-28 ENCOUNTER — Encounter: Payer: Self-pay | Admitting: Physician Assistant

## 2019-09-28 ENCOUNTER — Ambulatory Visit (INDEPENDENT_AMBULATORY_CARE_PROVIDER_SITE_OTHER): Payer: 59 | Admitting: Physician Assistant

## 2019-09-28 VITALS — BP 126/84 | HR 100 | Temp 97.3°F | Ht 67.0 in | Wt 174.0 lb

## 2019-09-28 DIAGNOSIS — Z202 Contact with and (suspected) exposure to infections with a predominantly sexual mode of transmission: Secondary | ICD-10-CM | POA: Diagnosis not present

## 2019-09-28 MED ORDER — AZITHROMYCIN 250 MG PO TABS: 1000.0000 mg | ORAL_TABLET | Freq: Once | ORAL | 0 refills | Status: AC

## 2019-09-28 MED ORDER — METRONIDAZOLE 500 MG PO TABS: 2000.0000 mg | ORAL_TABLET | Freq: Once | ORAL | 0 refills | Status: AC

## 2019-09-28 MED ORDER — CEFTRIAXONE SODIUM 250 MG IJ SOLR
250.0000 mg | Freq: Once | INTRAMUSCULAR | Status: AC
  Administered 2019-09-28: 15:00:00 250 mg via INTRAMUSCULAR

## 2019-09-28 NOTE — Patient Instructions (Signed)
It was great to see you!  We will send you a MyChart message with your lab and urine results. Please avoid sex until results are back.  Take the two prescriptions that I have sent in for you at the pharmacy.  I'm proud of your blood pressure and smoking!!  Take care,  Inda Coke PA-C

## 2019-09-28 NOTE — Progress Notes (Signed)
Joel [name] is a 43 y.o. male here for a follow up of a pre-existing problem.  I acted as a Neurosurgeon for [employer], PA-C Corky Mull, LPN  History of Present Illness:   Chief Complaint  Patient presents with  . Exposure to STD    HPI   STD testing Pt is here today requesting STD testing done. Friend dx with Trich and given antibiotics.  He found out these results yesterday.  He was sexually active with her a week ago or so.  Denies discharge from penis, no urinary symptoms, lymphadenopathy.  History reviewed. No pertinent past medical history.   Social History   Socioeconomic History  . Marital status: Single    Spouse name: Not on file  . Number of children: Not on file  . Years of education: Not on file  . Highest education level: Not on file  Occupational History  . Not on file  Social Needs  . Financial resource strain: Not on file  . Food insecurity    Worry: Not on file    Inability: Not on file  . Transportation needs    Medical: Not on file    Non-medical: Not on file  Tobacco Use  . Smoking status: Former Smoker    Types: Cigarettes    Start date: 04/28/1989    Quit date: 03/29/2019    Years since quitting: 0.5  . Smokeless tobacco: Never Used  Substance and Sexual Activity  . Alcohol use: Yes    Alcohol/week: 3.0 - 4.0 standard drinks    Types: 3 - 4 Standard drinks or equivalent per week  . Drug use: Not Currently    Comment: 4 years ago  . Sexual activity: Yes  Lifestyle  . Physical activity    Days per week: Not on file    Minutes per session: Not on file  . Stress: Not on file  Relationships  . Social Musician on phone: Not on file    Gets together: Not on file    Attends religious service: Not on file    Active member of club or organization: Not on file    Attends meetings of clubs or organizations: Not on file    Relationship status: Not on file  . Intimate partner violence    Fear of current or ex partner: Not  on file    Emotionally abused: Not on file    Physically abused: Not on file    Forced sexual activity: Not on file  Other Topics Concern  . Not on file  Social History Narrative   Work for [employer] in [place]   1 daughter -- 47 y/o   Not married    Past Surgical History:  Procedure Laterality Date  . FLEXIBLE SIGMOIDOSCOPY  05/08/2011   Reason for exam: hematochezia; Findings: internal and external hemorrhoids  . WISDOM TOOTH EXTRACTION      Family History  Problem Relation Age of Onset  . Hypertension Mother   . Alcohol abuse Mother   . Asthma Mother   . COPD Mother   . Drug abuse Mother   . Heart disease Mother   . Hypertension Sister   . Sleep apnea Sister   . Alcohol abuse Father   . Drug abuse Father   . Alcohol abuse Maternal Grandmother   . Alcohol abuse Maternal Grandfather   . Drug abuse Maternal Grandfather   . Diabetes Maternal Grandfather     No Known Allergies  Current  Medications:   Current Outpatient Medications:  .  amLODipine (NORVASC) 5 MG tablet, Take 1 tablet (5 mg total) by mouth daily., Disp: 90 tablet, Rfl: 1 .  atorvastatin (LIPITOR) 20 MG tablet, Take 1 tablet (20 mg total) by mouth daily., Disp: 90 tablet, Rfl: 0 .  azithromycin (ZITHROMAX) 250 MG tablet, Take 4 tablets (1,000 mg total) by mouth once for 1 dose., Disp: 4 tablet, Rfl: 0 .  metroNIDAZOLE (FLAGYL) 500 MG tablet, Take 4 tablets (2,000 mg total) by mouth once for 1 dose., Disp: 4 tablet, Rfl: 0   Review of Systems:   ROS Negative unless otherwise specified per HPI.  Vitals:   Vitals:   09/28/19 1507  BP: 126/84  Pulse: 100  Temp: (!) 97.3 F (36.3 C)  TempSrc: Temporal  SpO2: 98%  Weight: 174 lb (78.9 kg)  Height: 5\' 7"  (1.702 m)     Body mass index is 27.25 kg/m.  Physical Exam:   Physical Exam Vitals signs and nursing note reviewed.  Constitutional:      General: He is not in acute distress.    Appearance: He is well-developed. He is not ill-appearing or  toxic-appearing.  Cardiovascular:     Rate and Rhythm: Normal rate and regular rhythm.     Pulses: Normal pulses.     Heart sounds: Normal heart sounds, S1 normal and S2 normal.     Comments: No LE edema Pulmonary:     Effort: Pulmonary effort is normal.     Breath sounds: Normal breath sounds.  Skin:    General: Skin is warm and dry.  Neurological:     Mental Status: He is alert.     GCS: GCS eye subscore is 4. GCS verbal subscore is 5. GCS motor subscore is 6.  Psychiatric:        Speech: Speech normal.        Behavior: Behavior normal. Behavior is cooperative.      Assessment and Plan:   Joel [name] was seen today for exposure to std.  Diagnoses and all orders for this visit:  STD exposure Patient would like to be treated today.  Received 250 mg Rocephin injection and tolerated well.  I have also sent in a one-time dose of 1 g of azithromycin and 2 g of Flagyl.  Recommended abstaining from sex until results have returned.  Recommended condom use when he is sexually active. -     HIV antibody -     RPR -     Urine cytology ancillary only([place]) -     cefTRIAXone (ROCEPHIN) injection 250 mg  Other orders -     metroNIDAZOLE (FLAGYL) 500 MG tablet; Take 4 tablets (2,000 mg total) by mouth once for 1 dose. -     azithromycin (ZITHROMAX) 250 MG tablet; Take 4 tablets (1,000 mg total) by mouth once for 1 dose.  . Reviewed expectations re: course of current medical issues. . Discussed self-management of symptoms. . Outlined signs and symptoms indicating need for more acute intervention. . Patient verbalized understanding and all questions were answered. . See orders for this visit as documented in the electronic medical record. . Patient received an After-Visit Summary.  CMA or LPN served as scribe during this visit. History, Physical, and Plan performed by medical provider. The above documentation has been reviewed and is accurate and complete.  Inda Coke, PA-C

## 2019-09-29 ENCOUNTER — Encounter: Payer: Self-pay | Admitting: Physician Assistant

## 2019-09-29 LAB — HIV ANTIBODY (ROUTINE TESTING W REFLEX): HIV 1&2 Ab, 4th Generation: NONREACTIVE

## 2019-09-29 LAB — RPR: RPR Ser Ql: NONREACTIVE

## 2019-09-30 LAB — URINE CYTOLOGY ANCILLARY ONLY
Chlamydia: NEGATIVE
Comment: NEGATIVE
Comment: NEGATIVE
Comment: NORMAL
Neisseria Gonorrhea: NEGATIVE
Trichomonas: NEGATIVE

## 2019-10-03 ENCOUNTER — Other Ambulatory Visit: Payer: Self-pay | Admitting: *Deleted

## 2019-10-03 MED ORDER — ATORVASTATIN CALCIUM 20 MG PO TABS: 20.0000 mg | ORAL_TABLET | Freq: Every day | ORAL | 0 refills | Status: DC

## 2019-10-05 ENCOUNTER — Other Ambulatory Visit: Payer: Self-pay | Admitting: Physician Assistant

## 2019-10-05 MED ORDER — ATORVASTATIN CALCIUM 20 MG PO TABS: 20.0000 mg | ORAL_TABLET | Freq: Every day | ORAL | 0 refills | Status: DC

## 2019-11-01 ENCOUNTER — Other Ambulatory Visit: Payer: Self-pay

## 2019-11-01 ENCOUNTER — Encounter (HOSPITAL_COMMUNITY): Payer: Self-pay

## 2019-11-01 ENCOUNTER — Ambulatory Visit (HOSPITAL_COMMUNITY)
Admission: EM | Admit: 2019-11-01 | Discharge: 2019-11-01 | Disposition: A | Discharge status: Home / Self Care | Payer: 59 | Attending: Family Medicine | Admitting: Family Medicine

## 2019-11-01 DIAGNOSIS — R52 Pain, unspecified: Secondary | ICD-10-CM | POA: Insufficient documentation

## 2019-11-01 DIAGNOSIS — Z20822 Contact with and (suspected) exposure to covid-19: Secondary | ICD-10-CM | POA: Insufficient documentation

## 2019-11-01 DIAGNOSIS — R509 Fever, unspecified: Secondary | ICD-10-CM | POA: Diagnosis present

## 2019-11-01 DIAGNOSIS — R05 Cough: Secondary | ICD-10-CM | POA: Insufficient documentation

## 2019-11-01 DIAGNOSIS — R059 Cough, unspecified: Secondary | ICD-10-CM

## 2019-11-01 DIAGNOSIS — Z87891 Personal history of nicotine dependence: Secondary | ICD-10-CM | POA: Insufficient documentation

## 2019-11-01 DIAGNOSIS — Z833 Family history of diabetes mellitus: Secondary | ICD-10-CM | POA: Insufficient documentation

## 2019-11-01 LAB — POC SARS CORONAVIRUS 2 AG -  ED: SARS Coronavirus 2 Ag: NEGATIVE

## 2019-11-01 LAB — POC SARS CORONAVIRUS 2 AG: SARS Coronavirus 2 Ag: NEGATIVE

## 2019-11-01 MED ORDER — ACETAMINOPHEN 325 MG PO TABS
ORAL_TABLET | ORAL | Status: AC
  Filled 2019-11-01: qty 2

## 2019-11-01 MED ORDER — ACETAMINOPHEN 325 MG PO TABS
650.0000 mg | ORAL_TABLET | Freq: Once | ORAL | Status: AC
  Administered 2019-11-01: 650 mg via ORAL

## 2019-11-01 NOTE — ED Triage Notes (Signed)
Pt presents to UC with headache, body aches, chills and fever (102.0 F) x 1 day.

## 2019-11-02 ENCOUNTER — Other Ambulatory Visit: Payer: 59

## 2019-11-02 ENCOUNTER — Encounter: Payer: Self-pay | Admitting: Physician Assistant

## 2019-11-02 ENCOUNTER — Other Ambulatory Visit: Payer: Self-pay | Admitting: *Deleted

## 2019-11-02 MED ORDER — AMLODIPINE BESYLATE 5 MG PO TABS: 5.0000 mg | ORAL_TABLET | Freq: Every day | ORAL | 0 refills | Status: DC

## 2019-11-02 NOTE — ED Provider Notes (Signed)
[name]   [id number] 11/01/19 Arrival Time: 1903  ASSESSMENT & PLAN:  1. Fever and chills   2. Body aches   3. Cough     Rapid COVID testing negative here. PCR testing sent. See work note for self-isolation guidelines. High suspicion for COVID infection. Discussed.   Meds ordered this encounter  Medications  . acetaminophen (TYLENOL) tablet 650 mg   OTC symptom care as needed. No indication for chest imaging.  Follow-up Information    [facility].   Specialty: Urgent Care Why: If worsening or failing to improve as anticipated. Contact information: [place] [phone]          Reviewed expectations re: course of current medical issues. Questions answered. Outlined signs and symptoms indicating need for more acute intervention. Patient verbalized understanding. After Visit Summary given.   SUBJECTIVE: History from: patient. Joel [name] is a 44 y.o. male who requests COVID-19 testing. Known COVID-19 contact: none. Recent travel: none. Reports abrupt onset of fever/chills/body aches/coughing today. Denies: difficulty breathing and headache. Normal PO intake without n/v/d. Ambulatory without difficulty. No h/o lung dz.  Social History   Tobacco Use  Smoking Status Former Smoker  . Types: Cigarettes  . Start date: 04/28/1989  . Quit date: 03/29/2019  . Years since quitting: 0.5  Smokeless Tobacco Never Used     ROS: As per HPI.   OBJECTIVE:  Vitals:   11/01/19 2017  BP: 136/82  Pulse: (!) 104  Resp: (!) 22  Temp: (!) 102.6 F (39.2 C)  TempSrc: Oral  SpO2: 98%    General appearance: alert; no distress but appears very fatigued Eyes: PERRLA; EOMI; conjunctiva normal HENT: [place]; AT; nasal congestion; oral mucosa normal Neck: supple without LAD CV: regular; slight tachycardia Lungs: speaks full sentences without difficulty; unlabored (recheck RR  18) Extremities: no edema Skin: warm and dry Neurologic: normal gait Psychological: alert and cooperative; normal mood and affect  Labs: Results for orders placed or performed during the hospital encounter of 11/01/19  POC SARS Coronavirus 2 Ag-ED - Nasal Swab (BD Veritor Kit)  Result Value Ref Range   SARS Coronavirus 2 Ag NEGATIVE NEGATIVE  POC SARS Coronavirus 2 Ag  Result Value Ref Range   SARS Coronavirus 2 Ag NEGATIVE NEGATIVE   Labs Reviewed  NOVEL CORONAVIRUS, NAA (HOSP ORDER, SEND-OUT TO REF LAB; TAT 18-24 HRS)  POC SARS CORONAVIRUS 2 AG -  ED  POC SARS CORONAVIRUS 2 AG      No Known Allergies  Social History   Socioeconomic History  . Marital status: Single    Spouse name: Not on file  . Number of children: Not on file  . Years of education: Not on file  . Highest education level: Not on file  Occupational History  . Not on file  Tobacco Use  . Smoking status: Former Smoker    Types: Cigarettes    Start date: 04/28/1989    Quit date: 03/29/2019    Years since quitting: 0.5  . Smokeless tobacco: Never Used  Substance and Sexual Activity  . Alcohol use: Yes    Alcohol/week: 3.0 - 4.0 standard drinks    Types: 3 - 4 Standard drinks or equivalent per week  . Drug use: Not Currently    Comment: 4 years ago  . Sexual activity: Yes  Other Topics Concern  . Not on file  Social History Narrative   Work for [employer] in  [place]   1 daughter -- 52 y/o   Not married   Social Determinants of Radio broadcast assistant Strain:   . Difficulty of Paying Living Expenses: Not on file  Food Insecurity:   . Worried About Charity fundraiser in the Last Year: Not on file  . Ran Out of Food in the Last Year: Not on file  Transportation Needs:   . Lack of Transportation (Medical): Not on file  . Lack of Transportation (Non-Medical): Not on file  Physical Activity:   . Days of Exercise per Week: Not on file  . Minutes of Exercise per Session: Not on file  Stress:    . Feeling of Stress : Not on file  Social Connections:   . Frequency of Communication with Friends and Family: Not on file  . Frequency of Social Gatherings with Friends and Family: Not on file  . Attends Religious Services: Not on file  . Active Member of Clubs or Organizations: Not on file  . Attends Archivist Meetings: Not on file  . Marital Status: Not on file  Intimate Partner Violence:   . Fear of Current or Ex-Partner: Not on file  . Emotionally Abused: Not on file  . Physically Abused: Not on file  . Sexually Abused: Not on file   Family History  Problem Relation Age of Onset  . Hypertension Mother   . Alcohol abuse Mother   . Asthma Mother   . COPD Mother   . Drug abuse Mother   . Heart disease Mother   . Hypertension Sister   . Sleep apnea Sister   . Alcohol abuse Father   . Drug abuse Father   . Alcohol abuse Maternal Grandmother   . Alcohol abuse Maternal Grandfather   . Drug abuse Maternal Grandfather   . Diabetes Maternal Grandfather    Past Surgical History:  Procedure Laterality Date  . FLEXIBLE SIGMOIDOSCOPY  05/08/2011   Reason for exam: hematochezia; Findings: internal and external hemorrhoids  . WISDOM TOOTH EXTRACTION       Vanessa Kick, MD 11/02/19 1017

## 2019-11-03 ENCOUNTER — Other Ambulatory Visit: Payer: 59

## 2019-11-04 LAB — NOVEL CORONAVIRUS, NAA (HOSP ORDER, SEND-OUT TO REF LAB; TAT 18-24 HRS): SARS-CoV-2, NAA: NOT DETECTED

## 2020-05-14 ENCOUNTER — Encounter: Payer: Self-pay | Admitting: Physician Assistant

## 2020-05-14 ENCOUNTER — Telehealth: Payer: Self-pay

## 2020-05-14 ENCOUNTER — Telehealth (INDEPENDENT_AMBULATORY_CARE_PROVIDER_SITE_OTHER): Payer: 59 | Admitting: Physician Assistant

## 2020-05-14 VITALS — Ht 67.0 in | Wt 170.0 lb

## 2020-05-14 DIAGNOSIS — I10 Essential (primary) hypertension: Secondary | ICD-10-CM | POA: Diagnosis not present

## 2020-05-14 MED ORDER — AMLODIPINE BESYLATE 5 MG PO TABS: ORAL_TABLET | ORAL | 1 refills | Status: DC

## 2020-05-14 NOTE — Progress Notes (Signed)
Virtual Visit via Video   I connected with Joel [name] on 05/14/20 at 12:30 PM EDT by a video enabled telemedicine application and verified that I am speaking with the correct person using two identifiers. Location patient: Home Location provider: [employer], Office Persons participating in the virtual visit: Joel [name], [place] PA-C, Corky Mull, LPN   I discussed the limitations of evaluation and management by telemedicine and the availability of in person appointments. The patient expressed understanding and agreed to proceed.  I acted as a Neurosurgeon for [employer], PA-C [employer], LPN   Subjective:   HPI:  Hypertension Pt following up on blood pressure currently taking Amlodipine 5 mg daily. Checking blood pressure at home averaging upper 120's/80's. Pt denies headaches, dizziness, blurred vision, chest pain, SOB or lower leg edema. Denies excessive caffeine intake, stimulant usage, excessive alcohol intake or increase in salt consumption.  Wt Readings from Last 4 Encounters:  05/14/20 170 lb (77.1 kg)  09/28/19 174 lb (78.9 kg)  04/29/19 167 lb (75.8 kg)  10/29/18 172 lb 8 oz (78.2 kg)    ROS: See pertinent positives and negatives per HPI.  Patient Active Problem List   Diagnosis Date Noted  . Hyperlipidemia 04/29/2019  . Tobacco abuse 04/19/2018  . Overweight 04/19/2018  . Hypertension 02/19/2018    Social History   Tobacco Use  . Smoking status: Former Smoker    Types: Cigarettes    Start date: 04/28/1989    Quit date: 03/29/2019    Years since quitting: 1.1  . Smokeless tobacco: Never Used  Substance Use Topics  . Alcohol use: Yes    Alcohol/week: 3.0 - 4.0 standard drinks    Types: 3 - 4 Standard drinks or equivalent per week    Current Outpatient Medications:  .  amLODipine (NORVASC) 5 MG tablet, Take 5 mg daily., Disp: 90 tablet, Rfl: 1 .  atorvastatin (LIPITOR) 20 MG tablet, Take 1 tablet (20 mg total) by mouth  daily., Disp: 90 tablet, Rfl: 0  No Known Allergies  Objective:   VITALS: Per patient if applicable, see vitals. GENERAL: Alert, appears well and in no acute distress. HEENT: Atraumatic, conjunctiva clear, no obvious abnormalities on inspection of external nose and ears. NECK: Normal movements of the head and neck. CARDIOPULMONARY: No increased WOB. Speaking in clear sentences. I:E ratio WNL.  MS: Moves all visible extremities without noticeable abnormality. PSYCH: Pleasant and cooperative, well-groomed. Speech normal rate and rhythm. Affect is appropriate. Insight and judgement are appropriate. Attention is focused, linear, and appropriate.  NEURO: CN grossly intact. Oriented as arrived to appointment on time with no prompting. Moves both UE equally.  SKIN: No obvious lesions, wounds, erythema, or cyanosis noted on face or hands.  Assessment and Plan:   Joel [name] was seen today for hypertension.  Diagnoses and all orders for this visit:  Essential hypertension  Other orders -     amLODipine (NORVASC) 5 MG tablet; Take 5 mg daily.   Home numbers are well controlled.  Doing well on Norvasc 5 mg daily.  Recommend follow-up in 6 months, sooner if concerns.  . Reviewed expectations re: course of current medical issues. . Discussed self-management of symptoms. . Outlined signs and symptoms indicating need for more acute intervention. . Patient verbalized understanding and all questions were answered. Marland Kitchen Health Maintenance issues including appropriate healthy diet, exercise, and smoking avoidance were discussed with patient. . See orders for this visit as documented in the electronic medical record.  I discussed the assessment and treatment plan with the patient. The patient was provided an opportunity to ask questions and all were answered. The patient agreed with the plan and demonstrated an understanding of the instructions.   The patient was advised to call back or seek an in-person  evaluation if the symptoms worsen or if the condition fails to improve as anticipated.   CMA or LPN served as scribe during this visit. History, Physical, and Plan performed by medical provider. The above documentation has been reviewed and is accurate and complete.   [place], [place] 05/14/2020

## 2020-05-14 NOTE — Telephone Encounter (Signed)
error 

## 2020-05-14 NOTE — Telephone Encounter (Deleted)
Please schedule OV for Friday @ 3:

## 2020-05-14 NOTE — Progress Notes (Deleted)
° °  Virtual Visit via Video   I connected with [name] on 05/14/20 at 12:30 PM EDT by a video enabled telemedicine application and verified that I am speaking with the correct person using two identifiers. Location patient: Home Location provider: [employer], Office Persons participating in the virtual visit: Joel [name], [place] PA-C, Corky Mull, LPN   I discussed the limitations of evaluation and management by telemedicine and the availability of in person appointments. The patient expressed understanding and agreed to proceed.  I acted as a Neurosurgeon for [employer], PA-C [employer], LPN   Subjective:   HPI: Hypertension Pt following up on blood pressure currently taking Amlodipine 5 mg daily. Checking blood pressure at home averaging upper 120's/80's. Pt denies headaches, dizziness, blurred vision, chest pain, SOB or lower leg edema. Denies excessive caffeine intake, stimulant usage, excessive alcohol intake or increase in salt consumption.   ROS: See pertinent positives and negatives per HPI.  Patient Active Problem List   Diagnosis Date Noted   Hyperlipidemia 04/29/2019   Tobacco abuse 04/19/2018   Overweight 04/19/2018   Hypertension 02/19/2018    Social History   Tobacco Use   Smoking status: Former Smoker    Types: Cigarettes    Start date: 04/28/1989    Quit date: 03/29/2019    Years since quitting: 1.1   Smokeless tobacco: Never Used  Substance Use Topics   Alcohol use: Yes    Alcohol/week: 3.0 - 4.0 standard drinks    Types: 3 - 4 Standard drinks or equivalent per week    Current Outpatient Medications:    amLODipine (NORVASC) 5 MG tablet, Take 1 tablet (5 mg total) by mouth daily., Disp: 90 tablet, Rfl: 0   atorvastatin (LIPITOR) 20 MG tablet, Take 1 tablet (20 mg total) by mouth daily., Disp: 90 tablet, Rfl: 0  No Known Allergies  Objective:   VITALS: Per patient if applicable, see vitals. GENERAL:  Alert, appears well and in no acute distress. HEENT: Atraumatic, conjunctiva clear, no obvious abnormalities on inspection of external nose and ears. NECK: Normal movements of the head and neck. CARDIOPULMONARY: No increased WOB. Speaking in clear sentences. I:E ratio WNL.  MS: Moves all visible extremities without noticeable abnormality. PSYCH: Pleasant and cooperative, well-groomed. Speech normal rate and rhythm. Affect is appropriate. Insight and judgement are appropriate. Attention is focused, linear, and appropriate.  NEURO: CN grossly intact. Oriented as arrived to appointment on time with no prompting. Moves both UE equally.  SKIN: No obvious lesions, wounds, erythema, or cyanosis noted on face or hands.  Assessment and Plan:   There are no diagnoses linked to this encounter.   Reviewed expectations re: course of current medical issues.  Discussed self-management of symptoms.  Outlined signs and symptoms indicating need for more acute intervention.  Patient verbalized understanding and all questions were answered.  Health Maintenance issues including appropriate healthy diet, exercise, and smoking avoidance were discussed with patient.  See orders for this visit as documented in the electronic medical record.  I discussed the assessment and treatment plan with the patient. The patient was provided an opportunity to ask questions and all were answered. The patient agreed with the plan and demonstrated an understanding of the instructions.   The patient was advised to call back or seek an in-person evaluation if the symptoms worsen or if the condition fails to improve as anticipated.   ***  Corky Mull, LPN 04/25/2375

## 2020-05-18 ENCOUNTER — Encounter: Payer: Self-pay | Admitting: Physician Assistant

## 2020-05-18 ENCOUNTER — Other Ambulatory Visit: Payer: Self-pay

## 2020-05-18 ENCOUNTER — Ambulatory Visit: Payer: 59 | Admitting: Physician Assistant

## 2020-05-18 VITALS — BP 138/88 | HR 70 | Temp 98.0°F | Ht 67.0 in | Wt 177.2 lb

## 2020-05-18 DIAGNOSIS — R1030 Lower abdominal pain, unspecified: Secondary | ICD-10-CM

## 2020-05-18 DIAGNOSIS — K625 Hemorrhage of anus and rectum: Secondary | ICD-10-CM | POA: Diagnosis not present

## 2020-05-18 NOTE — Patient Instructions (Signed)
It was great to see you!  Let's get labs today.  You will be called about your referral for the gastrointestinal doctor to evaluate you.   Rectal Bleeding  Rectal bleeding is when blood passes out of the anus. People with rectal bleeding may notice bright red blood in their underwear or in the toilet after having a bowel movement. They may also have dark red or black stools. Rectal bleeding is usually a sign that something is wrong. Many things can cause rectal bleeding, including:  Hemorrhoids. These are blood vessels in the anus or rectum that are larger than normal.  Fistulas. These are abnormal passages in the rectum and anus.  Anal fissures. This is a tear in the anus.  Diverticulosis. This is a condition in which pockets or sacs project from the bowel.  Proctitis and colitis. These are conditions in which the rectum, colon, or anus become inflamed.  Polyps. These are growths that can be cancerous (malignant) or non-cancerous (benign).  Part of the rectum sticking out from the anus (rectal prolapse).  Certain medicines.  Intestinal infections. Follow these instructions at home: Pay attention to any changes in your symptoms. Take these actions to help lessen bleeding and discomfort:  Eat a diet that is high in fiber. This will keep your stool soft, making it easier to pass stools without straining. Ask your health care provider what foods and drinks are high in fiber.  Drink enough fluid to keep your urine clear or pale yellow. This also helps to keep your stool soft.  Try taking a warm bath. This may help soothe any pain in your rectum.  Keep all follow-up visits as told by your health care provider. This is important. Get help right away if:  You have new or increased rectal bleeding.  You have black or dark red stools.  You vomit blood or something that looks like coffee grounds.  You have pain or tenderness in your abdomen.  You have a fever.  You feel  weak.  You feel nauseous.  You faint.  You have severe pain in your rectum.  You cannot have a bowel movement. This information is not intended to replace advice given to you by your health care provider. Make sure you discuss any questions you have with your health care provider. Document Revised: 05/29/2016 Document Reviewed: 12/02/2015 Elsevier Patient Education  2020 [employer].

## 2020-05-18 NOTE — Progress Notes (Signed)
JERON GRAHN is a 44 y.o. male here for a new problem.  I acted as a Neurosurgeon for [employer], PA-C [employer], [place]  History of Present Illness:   Chief Complaint  Patient presents with  . Rectal Bleeding    HPI  Rectal bleeding;Abdominal pain Pt c/o rectal bleeding. Symptoms started about a week in a half ago. Denies constipation. Blood is in the toilet and when patient wipes. No rectal pain, only abdomen pain at times --abdominal pain will be before he eats, not worsening when he eats, and relieved with defecation.  He denies any weight loss, concerns for food intolerance.  He does state that he had a colonoscopy almost 10 years ago. This was done at [facility] he had a normal colonoscopy with the exception of internal and external hemorrhoids.  He currently does not feel any hemorrhoids.  He denies excessive intake of aspirin or NSAIDs.  He does not take drugs, but he does occasionally use marijuana.  Wt Readings from Last 3 Encounters:  05/18/20 177 lb 3.2 oz (80.4 kg)  05/14/20 170 lb (77.1 kg)  09/28/19 174 lb (78.9 kg)      Social History   Tobacco Use  . Smoking status: Former Smoker    Types: Cigarettes    Start date: 04/28/1989    Quit date: 03/29/2019    Years since quitting: 1.1  . Smokeless tobacco: Never Used  Vaping Use  . Vaping Use: Never used  Substance Use Topics  . Alcohol use: Yes    Alcohol/week: 3.0 - 4.0 standard drinks    Types: 3 - 4 Standard drinks or equivalent per week  . Drug use: Not Currently    Comment: 4 years ago    Past Surgical History:  Procedure Laterality Date  . FLEXIBLE SIGMOIDOSCOPY  05/08/2011   Reason for exam: hematochezia; Findings: internal and external hemorrhoids  . WISDOM TOOTH EXTRACTION      Family History  Problem Relation Age of Onset  . Hypertension Mother   . Alcohol abuse Mother   . Asthma Mother   . COPD Mother   . Drug abuse Mother   . Heart disease Mother   . Hypertension Sister   . Sleep apnea  Sister   . Alcohol abuse Father   . Drug abuse Father   . Alcohol abuse Maternal Grandmother   . Alcohol abuse Maternal Grandfather   . Drug abuse Maternal Grandfather   . Diabetes Maternal Grandfather     No Known Allergies  Current Medications:   Current Outpatient Medications:  .  amLODipine (NORVASC) 5 MG tablet, Take 5 mg daily., Disp: 90 tablet, Rfl: 1 .  atorvastatin (LIPITOR) 20 MG tablet, Take 1 tablet (20 mg total) by mouth daily., Disp: 90 tablet, Rfl: 0   Review of Systems:   ROS  Negative unless otherwise specified per HPI.  Vitals:   Vitals:   05/18/20 1534  BP: (!) 138/88  Pulse: 70  Temp: 98 F (36.7 C)  TempSrc: Temporal  SpO2: 95%  Weight: 177 lb 3.2 oz (80.4 kg)  Height: 5\' 7"  (1.702 m)     Body mass index is 27.75 kg/m.  Physical Exam:   Physical Exam Vitals and nursing note reviewed.  Constitutional:      General: He is not in acute distress.    Appearance: He is well-developed. He is not ill-appearing or toxic-appearing.  Cardiovascular:     Rate and Rhythm: Normal rate and regular rhythm.  Pulses: Normal pulses.     Heart sounds: Normal heart sounds, S1 normal and S2 normal.     Comments: No LE edema Pulmonary:     Effort: Pulmonary effort is normal.     Breath sounds: Normal breath sounds.  Abdominal:     General: Abdomen is flat. Bowel sounds are normal.     Palpations: Abdomen is soft.     Tenderness: There is no abdominal tenderness.  Skin:    General: Skin is warm and dry.  Neurological:     Mental Status: He is alert.     GCS: GCS eye subscore is 4. GCS verbal subscore is 5. GCS motor subscore is 6.  Psychiatric:        Speech: Speech normal.        Behavior: Behavior normal. Behavior is cooperative.       Assessment and Plan:   Murtaza was seen today for rectal bleeding.  Diagnoses and all orders for this visit:  Rectal bleeding; Lower abdominal pain Symptoms are occurring daily, with each bowel movement.   We will update his labs including CBC and CMP.  Urgent referral to gastroenterology.  Worsening precautions and ER precautions advised. -     CBC with Differential/Platelet; Future -     Comprehensive metabolic panel; Future -     Lipid panel; Future -     Comprehensive metabolic panel -     CBC with Differential/Platelet -     Lipid panel  . Reviewed expectations re: course of current medical issues. . Discussed self-management of symptoms. . Outlined signs and symptoms indicating need for more acute intervention. . Patient verbalized understanding and all questions were answered. . See orders for this visit as documented in the electronic medical record. . Patient received an After-Visit Summary.  CMA or LPN served as scribe during this visit. History, Physical, and Plan performed by medical provider. The above documentation has been reviewed and is accurate and complete.  [name], PA-C

## 2020-05-21 LAB — COMPREHENSIVE METABOLIC PANEL
AG Ratio: 1.5 (calc) (ref 1.0–2.5)
ALT: 47 U/L — ABNORMAL HIGH (ref 9–46)
AST: 54 U/L — ABNORMAL HIGH (ref 10–40)
Albumin: 4.4 g/dL (ref 3.6–5.1)
Alkaline phosphatase (APISO): 74 U/L (ref 36–130)
BUN: 14 mg/dL (ref 7–25)
CO2: 29 mmol/L (ref 20–32)
Calcium: 9.2 mg/dL (ref 8.6–10.3)
Chloride: 104 mmol/L (ref 98–110)
Creat: 1.12 mg/dL (ref 0.60–1.35)
Globulin: 2.9 g/dL (calc) (ref 1.9–3.7)
Glucose, Bld: 96 mg/dL (ref 65–99)
Potassium: 4.7 mmol/L (ref 3.5–5.3)
Sodium: 138 mmol/L (ref 135–146)
Total Bilirubin: 0.6 mg/dL (ref 0.2–1.2)
Total Protein: 7.3 g/dL (ref 6.1–8.1)

## 2020-05-21 LAB — LIPID PANEL
Cholesterol: 201 mg/dL — ABNORMAL HIGH (ref <200)
HDL: 51 mg/dL (ref >=40)
LDL Cholesterol (Calc): 127 mg/dL (calc) — ABNORMAL HIGH
Non-HDL Cholesterol (Calc): 150 mg/dL (calc) — ABNORMAL HIGH (ref <130)
Total CHOL/HDL Ratio: 3.9 (calc) (ref <5.0)
Triglycerides: 118 mg/dL (ref <150)

## 2020-05-21 LAB — CBC WITH DIFFERENTIAL/PLATELET
Absolute Monocytes: 584 cells/uL (ref 200–950)
Basophils Absolute: 20 cells/uL (ref 0–200)
Basophils Relative: 0.5 %
Eosinophils Absolute: 108 cells/uL (ref 15–500)
Eosinophils Relative: 2.7 %
HCT: 42.3 % (ref 38.5–50.0)
Hemoglobin: 14 g/dL (ref 13.2–17.1)
Lymphs Abs: 2452 cells/uL (ref 850–3900)
MCH: 30.6 pg (ref 27.0–33.0)
MCHC: 33.1 g/dL (ref 32.0–36.0)
MCV: 92.6 fL (ref 80.0–100.0)
MPV: 11.9 fL (ref 7.5–12.5)
Monocytes Relative: 14.6 %
Neutro Abs: 836 cells/uL — ABNORMAL LOW (ref 1500–7800)
Neutrophils Relative %: 20.9 %
Platelets: 212 10*3/uL (ref 140–400)
RBC: 4.57 10*6/uL (ref 4.20–5.80)
RDW: 13.7 % (ref 11.0–15.0)
Total Lymphocyte: 61.3 %
WBC: 4 10*3/uL (ref 3.8–10.8)

## 2020-05-21 LAB — TEST AUTHORIZATION

## 2020-05-24 ENCOUNTER — Other Ambulatory Visit: Payer: Self-pay | Admitting: Physician Assistant

## 2020-05-24 DIAGNOSIS — R7989 Other specified abnormal findings of blood chemistry: Secondary | ICD-10-CM

## 2020-05-25 ENCOUNTER — Encounter: Payer: Self-pay | Admitting: Gastroenterology

## 2020-06-07 ENCOUNTER — Other Ambulatory Visit: Payer: Self-pay

## 2020-06-07 ENCOUNTER — Other Ambulatory Visit: Payer: 59

## 2020-06-07 DIAGNOSIS — R7989 Other specified abnormal findings of blood chemistry: Secondary | ICD-10-CM

## 2020-06-08 LAB — HEPATIC FUNCTION PANEL
AG Ratio: 1.3 (calc) (ref 1.0–2.5)
ALT: 32 U/L (ref 9–46)
AST: 32 U/L (ref 10–40)
Albumin: 4.6 g/dL (ref 3.6–5.1)
Alkaline phosphatase (APISO): 81 U/L (ref 36–130)
Bilirubin, Direct: 0.1 mg/dL (ref 0.0–0.2)
Globulin: 3.5 g/dL (calc) (ref 1.9–3.7)
Indirect Bilirubin: 0.4 mg/dL (calc) (ref 0.2–1.2)
Total Bilirubin: 0.5 mg/dL (ref 0.2–1.2)
Total Protein: 8.1 g/dL (ref 6.1–8.1)

## 2020-07-11 ENCOUNTER — Encounter: Payer: Self-pay | Admitting: Gastroenterology

## 2020-07-11 ENCOUNTER — Other Ambulatory Visit (INDEPENDENT_AMBULATORY_CARE_PROVIDER_SITE_OTHER): Payer: 59

## 2020-07-11 ENCOUNTER — Ambulatory Visit: Payer: 59 | Admitting: Gastroenterology

## 2020-07-11 VITALS — BP 132/90 | HR 103 | Ht 67.0 in | Wt 175.0 lb

## 2020-07-11 DIAGNOSIS — K625 Hemorrhage of anus and rectum: Secondary | ICD-10-CM | POA: Diagnosis not present

## 2020-07-11 DIAGNOSIS — R140 Abdominal distension (gaseous): Secondary | ICD-10-CM | POA: Diagnosis not present

## 2020-07-11 DIAGNOSIS — R197 Diarrhea, unspecified: Secondary | ICD-10-CM

## 2020-07-11 DIAGNOSIS — R1084 Generalized abdominal pain: Secondary | ICD-10-CM

## 2020-07-11 LAB — C-REACTIVE PROTEIN: CRP: <1 mg/dL (ref 0.5–20.0)

## 2020-07-11 LAB — TSH: TSH: 0.36 u[IU]/mL (ref 0.35–4.50)

## 2020-07-11 LAB — SEDIMENTATION RATE: Sed Rate: 13 mm/hr (ref 0–15)

## 2020-07-11 LAB — IGA: IgA: 249 mg/dL (ref 68–378)

## 2020-07-11 MED ORDER — HYOSCYAMINE SULFATE 0.125 MG SL SUBL: 0.1250 mg | SUBLINGUAL_TABLET | Freq: Four times a day (QID) | SUBLINGUAL | 1 refills | Status: DC | PRN

## 2020-07-11 MED ORDER — SUTAB 1479-225-188 MG PO TABS: 1.0000 | ORAL_TABLET | ORAL | 0 refills | Status: DC

## 2020-07-11 NOTE — Patient Instructions (Signed)
If you are age 46 or older, your body mass index should be between 23-30. Your Body mass index is 27.41 kg/m. If this is out of the aforementioned range listed, please consider follow up with your Primary Care Provider.  If you are age 59 or younger, your body mass index should be between 19-25. Your Body mass index is 27.41 kg/m. If this is out of the aformentioned range listed, please consider follow up with your Primary Care Provider.   Your provider has requested that you go to the basement level for lab work before leaving today. Press "B" on the elevator. The lab is located at the first door on the left as you exit the elevator.  You have been scheduled for a colonoscopy. Please follow written instructions given to you at your visit today.  Please pick up your prep supplies at the pharmacy within the next 1-3 days. If you use inhalers (even only as needed), please bring them with you on the day of your procedure.  START Levsin 1 tablet every 6 hours as needed for abdominal pain.  Follow up pending your Colonoscopy.

## 2020-07-11 NOTE — Progress Notes (Signed)
07/11/2020 Joel [name] [place] [id number] 1976/01/02   HISTORY OF PRESENT ILLNESS: This is a 44 year old male who is new to our office.  He has been referred here by his PCP, Joel Motto, PA-C, for evaluation regarding rectal bleeding and lower abdominal pain.  He tells me that for the past few months he has been experiencing bad stomach pains and diarrhea.  He tells me that the symptoms have been intermittent, usually occurring at maybe 3 days a week or so.  Days in between he feels fine and has no symptoms.  He reports that when the abdominal pain is present it is in his mid to lower abdomen.  He tells me that his stomach has been hurting all day today.  He says that on occasion his abdominal pain will wake him up from sleep at night.  When he has diarrhea it is usually 3-4 times per day.  He has normal stools on his good days.  He says that he has not been able to identify anything specific in his diet that is causing this.  He denies any nausea or vomiting.  He says that his appetite is good.  He reports some bright red rectal bleeding.  He said that one time it was a somewhat large amount dripping into the toilet bowl, but usually is just small amounts on the toilet paper upon wiping.  He tells me that he had a colonoscopy about 9 or 10 years ago at [facility] for complaints of rectal bleeding and was only found to have hemorrhoids.   Past Medical History:  Diagnosis Date   Hyperlipemia    Hypertension    Sleep apnea    Past Surgical History:  Procedure Laterality Date   FLEXIBLE SIGMOIDOSCOPY  05/08/2011   Reason for exam: hematochezia; Findings: internal and external hemorrhoids   WISDOM TOOTH EXTRACTION      reports that he quit smoking about 15 months ago. His smoking use included cigarettes. He started smoking about 31 years ago. He has never used smokeless tobacco. He reports current alcohol use of about 3.0 - 4.0 standard drinks of alcohol per week. He reports previous drug  use. family history includes Alcohol abuse in his father, maternal grandfather, maternal grandmother, and mother; Asthma in his mother; COPD in his mother; Diabetes in his maternal grandfather; Drug abuse in his father, maternal grandfather, and mother; Heart disease in his mother; Hypertension in his mother and sister; Sleep apnea in his sister. No Known Allergies    Outpatient Encounter Medications as of 07/11/2020  Medication Sig   amLODipine (NORVASC) 5 MG tablet Take 5 mg daily.   atorvastatin (LIPITOR) 20 MG tablet Take 1 tablet (20 mg total) by mouth daily.   No facility-administered encounter medications on file as of 07/11/2020.     REVIEW OF SYSTEMS  : All other systems reviewed and negative except where noted in the History of Present Illness.   PHYSICAL EXAM: BP 132/90    Pulse (!) 103    Ht 5\' 7"  (1.702 m)    Wt 175 lb (79.4 kg)    BMI 27.41 kg/m  General: Well developed AA male in no acute distress Head: Normocephalic and atraumatic Eyes:  Sclerae anicteric, conjunctiva pink. Ears: Normal auditory acuity Lungs: Clear throughout to auscultation; no W/R/R. Heart: Slightly tachy; no M/R/G. Abdomen: Soft, non-distended.  BS present and somewhat hyperactive.  Minimal mid abdominal TTP. Rectal:  Will be done at the time of colonoscopy. Musculoskeletal: Symmetrical with no  gross deformities  Skin: No lesions on visible extremities Extremities: No edema  Neurological: Alert oriented x 4, grossly non-focal Psychological:  Alert and cooperative. Normal mood and affect  ASSESSMENT AND PLAN: *44 year old male with complaints of intermittent mid lower abdominal pain, diarrhea, and bloating over the past few months.  Occasional bright red blood on the toilet paper.  Has symptoms about 3 days a week.  Sounds most suspicious for IBS.  He did have a colonoscopy about 10 years ago at [facility].  He needs one again for both colorectal cancer screening as well as evaluation of his symptoms to  rule out inflammatory bowel disease, etc.  Basic labs performed recently were unremarkable.  We will check a TSH, sed rate, CRP, and celiac labs today.  We will schedule colonoscopy with Dr. Myrtie Neither.  In the interim we will try Levsin as needed to help with intestinal spasm/cramping.  Prescription sent to pharmacy.  CC:  Joel [name], [place]

## 2020-07-12 NOTE — Progress Notes (Signed)
____________________________________________________________  Attending physician addendum:  Thank you for sending this case to me. I have reviewed the entire note, and the outlined plan seems appropriate.  Perhaps IBS with hemorrhoidal bleeding, but certainly must proceed with colonoscopy to rule out IBD with the reported bleeding.  Amada Jupiter, MD  ____________________________________________________________

## 2020-07-13 LAB — TISSUE TRANSGLUTAMINASE, IGA: (tTG) Ab, IgA: <1 U/mL

## 2020-07-19 ENCOUNTER — Other Ambulatory Visit: Payer: Self-pay | Admitting: *Deleted

## 2020-07-19 MED ORDER — ATORVASTATIN CALCIUM 20 MG PO TABS: 20.0000 mg | ORAL_TABLET | Freq: Every day | ORAL | 1 refills | Status: DC

## 2020-08-27 ENCOUNTER — Ambulatory Visit (AMBULATORY_SURGERY_CENTER): Payer: 59 | Admitting: Gastroenterology

## 2020-08-27 ENCOUNTER — Other Ambulatory Visit: Payer: Self-pay

## 2020-08-27 ENCOUNTER — Encounter: Payer: Self-pay | Admitting: Gastroenterology

## 2020-08-27 VITALS — BP 132/89 | HR 71 | Temp 96.6°F | Resp 15 | Ht 67.0 in | Wt 175.0 lb

## 2020-08-27 DIAGNOSIS — K648 Other hemorrhoids: Secondary | ICD-10-CM

## 2020-08-27 DIAGNOSIS — R197 Diarrhea, unspecified: Secondary | ICD-10-CM

## 2020-08-27 DIAGNOSIS — K5730 Diverticulosis of large intestine without perforation or abscess without bleeding: Secondary | ICD-10-CM

## 2020-08-27 DIAGNOSIS — R1030 Lower abdominal pain, unspecified: Secondary | ICD-10-CM

## 2020-08-27 DIAGNOSIS — K625 Hemorrhage of anus and rectum: Secondary | ICD-10-CM | POA: Diagnosis not present

## 2020-08-27 MED ORDER — SODIUM CHLORIDE 0.9 % IV SOLN: 500.0000 mL | Freq: Once | INTRAVENOUS | Status: DC

## 2020-08-27 NOTE — Progress Notes (Signed)
Called to room to assist during endoscopic procedure.  Patient ID and intended procedure confirmed with present staff. Received instructions for my participation in the procedure from the performing physician.  

## 2020-08-27 NOTE — Op Note (Signed)
[facility] Patient Name: [facility] Procedure Date: 08/27/2020 2:39 PM MRN: [id number] Endoscopist: Sherilyn Cooter L. Myrtie Neither , MD Age: 44 Referring MD:  Date of Birth: 09/20/1976 Gender: Male Account #: [id number] Procedure:                Colonoscopy Indications:              Lower abdominal pain, Chronic diarrhea, Rectal                            bleeding (symptoms lately abated) Medicines:                Monitored Anesthesia Care Procedure:                Pre-Anesthesia Assessment:                           - Prior to the procedure, a History and Physical                            was performed, and patient medications and                            allergies were reviewed. The patient's tolerance of                            previous anesthesia was also reviewed. The risks                            and benefits of the procedure and the sedation                            options and risks were discussed with the patient.                            All questions were answered, and informed consent                            was obtained. Prior Anticoagulants: The patient has                            taken no previous anticoagulant or antiplatelet                            agents. ASA Grade Assessment: II - A patient with                            mild systemic disease. After reviewing the risks                            and benefits, the patient was deemed in                            satisfactory condition to undergo the procedure.  After obtaining informed consent, the colonoscope                            was passed under direct vision. Throughout the                            procedure, the patient's blood pressure, pulse, and                            oxygen saturations were monitored continuously. The                            Colonoscope was introduced through the anus and                            advanced to the the cecum,  identified by                            appendiceal orifice and ileocecal valve. The                            colonoscopy was performed without difficulty. The                            patient tolerated the procedure well. The quality                            of the bowel preparation was excellent. The                            ileocecal valve, appendiceal orifice, and rectum                            were photographed. Scope In: 2:46:41 PM Scope Out: 2:57:55 PM Scope Withdrawal Time: 0 hours 9 minutes 0 seconds  Total Procedure Duration: 0 hours 11 minutes 14 seconds  Findings:                 The digital rectal exam findings include prolapsed                            hypertrophied anal papilla.                           Normal mucosa was found in the entire colon.                            Biopsies for histology were taken with a cold                            forceps from the right colon and left colon for                            evaluation of microscopic colitis.  A few small-mouthed diverticula were found in the                            left colon.                           Internal hemorrhoids were found (RA column most                            prominent). The hemorrhoids were small.                           The exam was otherwise without abnormality on                            direct and retroflexion views. Complications:            No immediate complications. Estimated Blood Loss:     Estimated blood loss was minimal. Impression:               - Prolapsed hypertrophied anal papilla found on                            digital rectal exam.                           - Normal mucosa in the entire examined colon.                            Biopsied.                           - Diverticulosis in the left colon.                           - Internal hemorrhoids. Cause of bleeding.                           - The examination was otherwise  normal on direct                            and retroflexion views. Recommendation:           - Patient has a contact number available for                            emergencies. The signs and symptoms of potential                            delayed complications were discussed with the                            patient. Return to normal activities tomorrow.                            Written discharge instructions were provided to the  patient.                           - Resume previous diet.                           - Continue present medications.                           - Await pathology results.                           - Return to my office at appointment to be                            scheduled.                           - Repeat colonoscopy in 10 years for screening                            purposes. Makyia Erxleben L. Myrtie Neitheranis, MD 08/27/2020 3:07:11 PM This report has been signed electronically.

## 2020-08-27 NOTE — Patient Instructions (Signed)
Impression/Recommendations:  Diverticulosis and hemorrhoid handouts given to patient.  Resume previous diet. Continue present medications. Await pathology results.  Return to my office at appointment to be scheduled.  Repeat colonoscopy in 10 years for screening purposes.  YOU HAD AN ENDOSCOPIC PROCEDURE TODAY AT THE [facility]:   Refer to the procedure report that was given to you for any specific questions about what was found during the examination.  If the procedure report does not answer your questions, please call your gastroenterologist to clarify.  If you requested that your care partner not be given the details of your procedure findings, then the procedure report has been included in a sealed envelope for you to review at your convenience later.  YOU SHOULD EXPECT: Some feelings of bloating in the abdomen. Passage of more gas than usual.  Walking can help get rid of the air that was put into your GI tract during the procedure and reduce the bloating. If you had a lower endoscopy (such as a colonoscopy or flexible sigmoidoscopy) you may notice spotting of blood in your stool or on the toilet paper. If you underwent a bowel prep for your procedure, you may not have a normal bowel movement for a few days.  Please Note:  You might notice some irritation and congestion in your nose or some drainage.  This is from the oxygen used during your procedure.  There is no need for concern and it should clear up in a day or so.  SYMPTOMS TO REPORT IMMEDIATELY:   Following lower endoscopy (colonoscopy or flexible sigmoidoscopy):  Excessive amounts of blood in the stool  Significant tenderness or worsening of abdominal pains  Swelling of the abdomen that is new, acute  Fever of 100F or higher  For urgent or emergent issues, a gastroenterologist can be reached at any hour by calling [phone]. Do not use MyChart messaging for urgent concerns.    DIET:  We do recommend a  small meal at first, but then you may proceed to your regular diet.  Drink plenty of fluids but you should avoid alcoholic beverages for 24 hours.  ACTIVITY:  You should plan to take it easy for the rest of today and you should NOT DRIVE or use heavy machinery until tomorrow (because of the sedation medicines used during the test).    FOLLOW UP: Our staff will call the number listed on your records 48-72 hours following your procedure to check on you and address any questions or concerns that you may have regarding the information given to you following your procedure. If we do not reach you, we will leave a message.  We will attempt to reach you two times.  During this call, we will ask if you have developed any symptoms of COVID 19. If you develop any symptoms (ie: fever, flu-like symptoms, shortness of breath, cough etc.) before then, please call [phone].  If you test positive for Covid 19 in the 2 weeks post procedure, please call and report this information to [place].    If any biopsies were taken you will be contacted by phone or by letter within the next 1-3 weeks.  Please call us at [phone] if you have not heard about the biopsies in 3 weeks.    SIGNATURES/CONFIDENTIALITY: You and/or your care partner have signed paperwork which will be entered into your electronic medical record.  These signatures attest to the fact that that the information above on your After Visit Summary has been  reviewed and is understood.  Full responsibility of the confidentiality of this discharge information lies with you and/or your care-partner. 

## 2020-08-27 NOTE — Progress Notes (Signed)
To PACU, VSS. Report to [email or web address] 

## 2020-08-27 NOTE — Progress Notes (Signed)
Medical history reviewed with no changes noted. VS assessed by C.W 

## 2020-08-29 ENCOUNTER — Telehealth: Payer: Self-pay | Admitting: *Deleted

## 2020-08-29 NOTE — Telephone Encounter (Signed)
  Follow up Call-  Call back number 08/27/2020  Post procedure Call Back phone  # [phone]  Permission to leave phone message Yes  Some recent data might be hidden     Patient questions:  Do you have a fever, pain , or abdominal swelling? No. Pain Score  0 *  Have you tolerated food without any problems? Yes.    Have you been able to return to your normal activities? Yes.    Do you have any questions about your discharge instructions: Diet   No. Medications  No. Follow up visit  No.  Do you have questions or concerns about your Care? No.  Actions: * If pain score is 4 or above: No action needed, pain <4.  1. Have you developed a fever since your procedure? no  2.   Have you had an respiratory symptoms (SOB or cough) since your procedure? no  3.   Have you tested positive for COVID 19 since your procedure no  4.   Have you had any family members/close contacts diagnosed with the COVID 19 since your procedure?  no   If yes to any of these questions please route to Laverna Peace, RN and Karlton Lemon, RN

## 2020-09-05 ENCOUNTER — Telehealth: Payer: Self-pay | Admitting: Gastroenterology

## 2020-09-05 NOTE — Telephone Encounter (Signed)
Pt is returning a missed call regarding his pathology results

## 2020-09-05 NOTE — Telephone Encounter (Signed)
Lm on vm for patient to return call 

## 2020-09-05 NOTE — Telephone Encounter (Signed)
Spoke with patient, see 08/27/20 pathology result note for more information.

## 2020-09-27 ENCOUNTER — Ambulatory Visit (INDEPENDENT_AMBULATORY_CARE_PROVIDER_SITE_OTHER): Payer: 59 | Admitting: Gastroenterology

## 2020-09-27 ENCOUNTER — Encounter: Payer: Self-pay | Admitting: Gastroenterology

## 2020-09-27 VITALS — BP 120/84 | HR 84 | Ht 67.0 in | Wt 180.4 lb

## 2020-09-27 DIAGNOSIS — R140 Abdominal distension (gaseous): Secondary | ICD-10-CM

## 2020-09-27 DIAGNOSIS — K529 Noninfective gastroenteritis and colitis, unspecified: Secondary | ICD-10-CM | POA: Diagnosis not present

## 2020-09-27 DIAGNOSIS — K648 Other hemorrhoids: Secondary | ICD-10-CM

## 2020-09-27 NOTE — Progress Notes (Signed)
     [name]  Chief Complaint: Abdominal pain and diarrhea  Subjective  History: Seen in clinic 07/11/2020 for chronic abdominal pain diarrhea and rectal bleeding.  Hyoscyamine prescribed, which helped the symptoms significantly abate at the time of colonoscopy on 08/27/2020.  That exam had mild diverticulosis and small internal hemorrhoids, biopsies negative for microscopic colitis.  Joel [name] is feeling well these days.  He has only had a few episodes of abdominal cramps or diarrhea since I last saw.  Levsin works well if his stomach is upset.  He has not had rectal bleeding since the colonoscopy.  We talked about some healthier diet choices with more fruits and vegetables for fiber.  It sounds like he times eats fast food for lunch.  ROS: Cardiovascular:  no chest pain Respiratory: no dyspnea  The patient's Past Medical, Family and Social History were reviewed and are on file in the EMR.  Objective:  Med list reviewed  Current Outpatient Medications:  .  amLODipine (NORVASC) 5 MG tablet, Take 5 mg daily., Disp: 90 tablet, Rfl: 1 .  atorvastatin (LIPITOR) 20 MG tablet, Take 1 tablet (20 mg total) by mouth daily., Disp: 90 tablet, Rfl: 1 .  hyoscyamine (LEVSIN SL) 0.125 MG SL tablet, Place 1 tablet (0.125 mg total) under the tongue every 6 (six) hours as needed., Disp: 30 tablet, Rfl: 1   Vital signs in last 24 hrs: Vitals:   09/27/20 1539  BP: 120/84  Pulse: 84    Physical Exam   Well-appearing  Cardiac: RRR without murmurs, S1S2 heard, no peripheral edema  Pulm: clear to auscultation bilaterally, normal RR and effort noted  Abdomen: soft, no tenderness, with active bowel sounds. No guarding or palpable hepatosplenomegaly.   Labs:  TSH, ESR, celiac labs normal  ___________________________________________ Radiologic studies:   ____________________________________________ Other: Colon biopsy  normal  _____________________________________________ Assessment & Plan  Assessment: Encounter Diagnoses  Name Primary?  . Abdominal bloating Yes  . Chronic diarrhea   . Internal hemorrhoids    He seems to have mild irritable bowel and probable maldigestion/dietary triggers.  We discussed a healthy diet and lifestyle.  He is a non-smoker without much alcohol use.  Levsin has worked well as needed and I encouraged him to continue it, will be refilled as needed  His small internal hemorrhoids have no longer been bleeding with better control of the bowel habits.  He will see me as needed.    20 minutes were spent on this encounter (including chart review, history/exam, counseling/coordination of care, and documentation)  Joel [name]

## 2020-09-27 NOTE — Patient Instructions (Signed)
Please follow up with Dr Myrtie Neither as needed.  If you are age 46 or older, your body mass index should be between 23-30. Your Body mass index is 28.25 kg/m. If this is out of the aforementioned range listed, please consider follow up with your Primary Care Provider.  If you are age 29 or younger, your body mass index should be between 19-25. Your Body mass index is 28.25 kg/m. If this is out of the aformentioned range listed, please consider follow up with your Primary Care Provider.   Due to recent changes in healthcare laws, you may see the results of your imaging and laboratory studies on MyChart before your provider has had a chance to review them.  We understand that in some cases there may be results that are confusing or concerning to you. Not all laboratory results come back in the same time frame and the provider may be waiting for multiple results in order to interpret others.  Please give [place] 48 hours in order for your provider to thoroughly review all the results before contacting the office for clarification of your results.   It was a pleasure to see you today!  Dr. Myrtie Neither

## 2020-11-07 ENCOUNTER — Ambulatory Visit: Payer: 59 | Admitting: Physician Assistant

## 2020-11-08 ENCOUNTER — Encounter: Payer: Self-pay | Admitting: Physician Assistant

## 2020-11-08 ENCOUNTER — Other Ambulatory Visit: Payer: Self-pay

## 2020-11-08 ENCOUNTER — Ambulatory Visit: Payer: 59 | Admitting: Physician Assistant

## 2020-11-08 ENCOUNTER — Other Ambulatory Visit (HOSPITAL_COMMUNITY)
Admission: RE | Admit: 2020-11-08 | Discharge: 2020-11-08 | Disposition: A | Discharge status: Home / Self Care | Payer: 59 | Source: Ambulatory Visit | Attending: Physician Assistant | Admitting: Physician Assistant

## 2020-11-08 VITALS — BP 128/80 | HR 91 | Temp 98.7°F | Ht 67.0 in | Wt 181.2 lb

## 2020-11-08 DIAGNOSIS — Z202 Contact with and (suspected) exposure to infections with a predominantly sexual mode of transmission: Secondary | ICD-10-CM | POA: Diagnosis present

## 2020-11-08 MED ORDER — METRONIDAZOLE 500 MG PO TABS: 500.0000 mg | ORAL_TABLET | Freq: Two times a day (BID) | ORAL | 0 refills | Status: DC

## 2020-11-08 MED ORDER — CEFTRIAXONE SODIUM 500 MG IJ SOLR
500.0000 mg | Freq: Once | INTRAMUSCULAR | Status: AC
  Administered 2020-11-08: 500 mg via INTRAMUSCULAR

## 2020-11-08 MED ORDER — AZITHROMYCIN 250 MG PO TABS
1000.0000 mg | ORAL_TABLET | Freq: Once | ORAL | Status: AC
  Administered 2020-11-08: 1000 mg via ORAL

## 2020-11-08 NOTE — Progress Notes (Signed)
Joel [name] is a 45 y.o. male here for a new problem.  I acted as a Neurosurgeon for [employer], PA-C Corky Mull, LPN   History of Present Illness:   Chief Complaint  Patient presents with  . Exposure to STD    HPI  Exposure to STD Pt was exposed to Trichomonas by his partner, he found out Friday, she had a routine PAP smear and was asymptomatic. He is pretty confident she is not sexually active with someone else. Last sexually active with this partner in November and did not use condom.  Denies: dysuria, fever, chills, malaise, pelvic pain, penile discharge  Past Medical History:  Diagnosis Date  . Hyperlipemia   . Hypertension   . Sleep apnea      Social History   Tobacco Use  . Smoking status: Former Smoker    Types: Cigarettes    Start date: 04/28/1989    Quit date: 03/29/2019    Years since quitting: 1.6  . Smokeless tobacco: Never Used  Vaping Use  . Vaping Use: Never used  Substance Use Topics  . Alcohol use: Yes    Alcohol/week: 3.0 - 4.0 standard drinks    Types: 3 - 4 Standard drinks or equivalent per week  . Drug use: Not Currently    Comment: 4 years ago    Past Surgical History:  Procedure Laterality Date  . FLEXIBLE SIGMOIDOSCOPY  05/08/2011   Reason for exam: hematochezia; Findings: internal and external hemorrhoids  . WISDOM TOOTH EXTRACTION      Family History  Problem Relation Age of Onset  . Hypertension Mother   . Alcohol abuse Mother   . Asthma Mother   . COPD Mother   . Drug abuse Mother   . Heart disease Mother   . Hypertension Sister   . Sleep apnea Sister   . Alcohol abuse Father   . Drug abuse Father   . Alcohol abuse Maternal Grandmother   . Alcohol abuse Maternal Grandfather   . Drug abuse Maternal Grandfather   . Diabetes Maternal Grandfather   . Colon cancer Neg Hx   . Esophageal cancer Neg Hx   . Prostate cancer Neg Hx   . Rectal cancer Neg Hx   . Stomach cancer Neg Hx     No Known Allergies  Current  Medications:   Current Outpatient Medications:  .  amLODipine (NORVASC) 5 MG tablet, Take 5 mg daily., Disp: 90 tablet, Rfl: 1 .  atorvastatin (LIPITOR) 20 MG tablet, Take 1 tablet (20 mg total) by mouth daily., Disp: 90 tablet, Rfl: 1 .  hyoscyamine (LEVSIN SL) 0.125 MG SL tablet, Place 1 tablet (0.125 mg total) under the tongue every 6 (six) hours as needed., Disp: 30 tablet, Rfl: 1 .  metroNIDAZOLE (FLAGYL) 500 MG tablet, Take 1 tablet (500 mg total) by mouth 2 (two) times daily., Disp: 14 tablet, Rfl: 0   Review of Systems:   ROS Negative unless otherwise specified per HPI.  Vitals:   Vitals:   11/08/20 1537  BP: 128/80  Pulse: 91  Temp: 98.7 F (37.1 C)  TempSrc: Temporal  SpO2: 97%  Weight: 181 lb 4 oz (82.2 kg)  Height: 5\' 7"  (1.702 m)     Body mass index is 28.39 kg/m.  Physical Exam:   Physical Exam Vitals and nursing note reviewed.  Constitutional:      General: He is not in acute distress.    Appearance: He is well-developed. He is not ill-appearing, toxic-appearing  or sickly-appearing.  Cardiovascular:     Rate and Rhythm: Normal rate and regular rhythm.     Pulses: Normal pulses.     Heart sounds: Normal heart sounds, S1 normal and S2 normal.     Comments: No LE edema Pulmonary:     Effort: Pulmonary effort is normal.     Breath sounds: Normal breath sounds.  Skin:    General: Skin is warm, dry and intact.  Neurological:     Mental Status: He is alert.     GCS: GCS eye subscore is 4. GCS verbal subscore is 5. GCS motor subscore is 6.  Psychiatric:        Mood and Affect: Mood and affect normal.        Speech: Speech normal.        Behavior: Behavior normal. Behavior is cooperative.      Assessment and Plan:   Kairyn was seen today for exposure to std.  Diagnoses and all orders for this visit:  STD exposure Concern for possible trich exposure. He would like treatment today. Received 500 mg rocephin injection and 1 g Azithromycin po in  office. Tolerated well. I have also send in Flagyl 500 mg BID x 7 days for him. HIV and RPR also ordered at this time. -     HIV Antibody (routine testing w rflx) -     RPR -     Urine cytology ancillary only([place])  Other orders -     metroNIDAZOLE (FLAGYL) 500 MG tablet; Take 1 tablet (500 mg total) by mouth 2 (two) times daily.   CMA or LPN served as scribe during this visit. History, Physical, and Plan performed by medical provider. The above documentation has been reviewed and is accurate and complete.   [name], PA-C

## 2020-11-08 NOTE — Patient Instructions (Signed)
It was great to see you!  Rocephin injection today  Oral azithromycin in the office given  Pick up oral flagyl to start for 7 days  I will be in touch with all of the results in the meantime.  Take care,  [name] PA-C

## 2020-11-09 LAB — HIV ANTIBODY (ROUTINE TESTING W REFLEX): HIV 1&2 Ab, 4th Generation: NONREACTIVE

## 2020-11-09 LAB — RPR: RPR Ser Ql: NONREACTIVE

## 2020-11-12 LAB — URINE CYTOLOGY ANCILLARY ONLY
Chlamydia: NEGATIVE
Comment: NEGATIVE
Comment: NEGATIVE
Comment: NORMAL
Neisseria Gonorrhea: NEGATIVE
Trichomonas: NEGATIVE

## 2020-12-31 ENCOUNTER — Other Ambulatory Visit: Payer: Self-pay | Admitting: Gastroenterology

## 2021-06-17 ENCOUNTER — Other Ambulatory Visit: Payer: Self-pay

## 2021-06-17 ENCOUNTER — Ambulatory Visit
Admission: EM | Admit: 2021-06-17 | Discharge: 2021-06-17 | Disposition: A | Discharge status: Home / Self Care | Payer: 59 | Attending: Internal Medicine | Admitting: Internal Medicine

## 2021-06-17 ENCOUNTER — Encounter: Payer: Self-pay | Admitting: Emergency Medicine

## 2021-06-17 ENCOUNTER — Ambulatory Visit (INDEPENDENT_AMBULATORY_CARE_PROVIDER_SITE_OTHER): Payer: 59

## 2021-06-17 DIAGNOSIS — S93601A Unspecified sprain of right foot, initial encounter: Secondary | ICD-10-CM | POA: Diagnosis not present

## 2021-06-17 DIAGNOSIS — M79671 Pain in right foot: Secondary | ICD-10-CM

## 2021-06-17 DIAGNOSIS — W19XXXA Unspecified fall, initial encounter: Secondary | ICD-10-CM

## 2021-06-17 MED ORDER — NAPROXEN 500 MG PO TABS: 500.0000 mg | ORAL_TABLET | Freq: Two times a day (BID) | ORAL | 0 refills | Status: DC | PRN

## 2021-06-17 NOTE — ED Triage Notes (Signed)
Fell on right foot yesterday afternoon, reports pain and swelling. Wants to rule out any breaks in foot. Cap refill less than 3 seconds, sensation intact.

## 2021-06-17 NOTE — Discharge Instructions (Addendum)
The XR of your foot did not show any fractures (breaks) in your foot. RICE treatment (rest, ice, compression and elevation) You can continue wearing your walking boot for the next couple of days if you feel as if it's helpful.  Take medications as needed for pain.

## 2021-06-17 NOTE — ED Provider Notes (Signed)
[name]    CSN: [id number] Arrival date & time: 06/17/21  0959      History   Chief Complaint Chief Complaint  Patient presents with   Foot Injury    HPI Joel [name] is a 45 y.o. male.    Joel [name] is a 45 y.o. male that complains of pain in the dorsal aspect of the right foot without radiation after a fall. Onset of symptoms was last night. Patient states that his leg "gave out" when he got out of bed which caused him to fall. He describes pain as aching and throbbing. Pain severity now is 6 /10. Pain is aggravated by weight bearing. Pain is alleviated by rest, ice, and wearing his walker boot which he had from a prior injury several years ago. He denies any  numbness, tingling, weakness, loss of sensation, loss of motion, or the inability to bear weight. The patient denies other injuries.      Past Medical History:  Diagnosis Date   Hyperlipemia    Hypertension    Sleep apnea     Patient Active Problem List   Diagnosis Date Noted   Diarrhea 07/11/2020   Generalized abdominal pain 07/11/2020   Rectal bleeding 07/11/2020   Bloating 07/11/2020   Hyperlipidemia 04/29/2019   Tobacco abuse 04/19/2018   Overweight 04/19/2018   Hypertension 02/19/2018    Past Surgical History:  Procedure Laterality Date   FLEXIBLE SIGMOIDOSCOPY  05/08/2011   Reason for exam: hematochezia; Findings: internal and external hemorrhoids   WISDOM TOOTH EXTRACTION         Home Medications    Prior to Admission medications   Medication Sig Start Date End Date Taking? Authorizing Provider  naproxen (NAPROSYN) 500 MG tablet Take 1 tablet (500 mg total) by mouth 2 (two) times daily as needed for mild pain. 06/17/21  Yes Lurline Idol, FNP  amLODipine (NORVASC) 5 MG tablet Take 5 mg daily. 05/14/20   [name], PA  atorvastatin (LIPITOR) 20 MG tablet Take 1 tablet (20 mg total) by mouth daily. 07/19/20   [name], PA  hyoscyamine (LEVSIN  SL) 0.125 MG SL tablet DISSOLVE ONE TABLET UNDER THE TONGUE EVERY 6 HOURS AS NEEDED 12/31/20   Zehr, Princella Pellegrini, PA-C    Family History Family History  Problem Relation Age of Onset   Hypertension Mother    Alcohol abuse Mother    Asthma Mother    COPD Mother    Drug abuse Mother    Heart disease Mother    Hypertension Sister    Sleep apnea Sister    Alcohol abuse Father    Drug abuse Father    Alcohol abuse Maternal Grandmother    Alcohol abuse Maternal Grandfather    Drug abuse Maternal Grandfather    Diabetes Maternal Grandfather    Colon cancer Neg Hx    Esophageal cancer Neg Hx    Prostate cancer Neg Hx    Rectal cancer Neg Hx    Stomach cancer Neg Hx     Social History Social History   Tobacco Use   Smoking status: Former    Types: Cigarettes    Start date: 04/28/1989    Quit date: 03/29/2019    Years since quitting: 2.2   Smokeless tobacco: Never  Vaping Use   Vaping Use: Never used  Substance Use Topics   Alcohol use: Yes    Alcohol/week: 3.0 - 4.0 standard drinks    Types: 3 - 4 Standard drinks  or equivalent per week   Drug use: Not Currently    Comment: 4 years ago     Allergies   Patient has no known allergies.   Review of Systems Review of Systems  Musculoskeletal:  Positive for arthralgias and gait problem. Negative for joint swelling.  All other systems reviewed and are negative.   Physical Exam Triage Vital Signs ED Triage Vitals  Enc Vitals Group     BP 06/17/21 1115 (!) 147/88     Pulse Rate 06/17/21 1115 81     Resp 06/17/21 1115 16     Temp 06/17/21 1115 98 F (36.7 C)     Temp Source 06/17/21 1115 Oral     SpO2 06/17/21 1115 98 %     Weight --      Height --      Head Circumference --      Peak Flow --      Pain Score 06/17/21 1119 6     Pain Loc --      Pain Edu? --      Excl. in GC? --    No data found.  Updated Vital Signs BP (!) 147/88 (BP Location: Left Arm)   Pulse 81   Temp 98 F (36.7 C) (Oral)   Resp 16    SpO2 98%   Visual Acuity Right Eye Distance:   Left Eye Distance:   Bilateral Distance:    Right Eye Near:   Left Eye Near:    Bilateral Near:     Physical Exam Vitals reviewed.  Constitutional:      Appearance: Normal appearance.  HENT:     Head: Normocephalic.  Cardiovascular:     Rate and Rhythm: Normal rate.  Pulmonary:     Effort: Pulmonary effort is normal.  Musculoskeletal:        General: Normal range of motion.     Cervical back: Normal range of motion and neck supple.     Right foot: Normal range of motion and normal capillary refill. Tenderness present. No swelling, deformity or bony tenderness.     Left foot: Normal.  Feet:     Right foot:     Skin integrity: Skin integrity normal.     Toenail Condition: Right toenails are normal.  Skin:    General: Skin is warm and dry.  Neurological:     General: No focal deficit present.     Mental Status: He is alert and oriented to person, place, and time.  Psychiatric:        Mood and Affect: Mood normal.        Behavior: Behavior normal.     UC Treatments / Results  Labs (all labs ordered are listed, but only abnormal results are displayed) Labs Reviewed - No data to display  EKG   Radiology DG Foot Complete Right  Result Date: 06/17/2021 CLINICAL DATA:  Fall.  Pain and swelling. EXAM: RIGHT FOOT COMPLETE - 3+ VIEW COMPARISON:  None. FINDINGS: There is no evidence of fracture or dislocation. There is no evidence of arthropathy or other focal bone abnormality. Soft tissues are unremarkable. IMPRESSION: Negative. Electronically Signed   By: Signa Kell M.D.   On: 06/17/2021 11:58    Procedures Procedures (including critical care time)  Medications Ordered in UC Medications - No data to display  Initial Impression / Assessment and Plan / UC Course  I have reviewed the triage vital signs and the nursing notes.  Pertinent labs & imaging results  that were available during my care of the patient were  reviewed by me and considered in my medical decision making (see chart for details).     45 yo male that presents with right foot pain after a mechanical fall yesterday. Pain is worse with weight bearing. No swelling. He has had some relief in symptoms with rest, ice, and wearing his walker boot. XR did not show any fractures or other abnormalities. Advised supportive care with REST. WBAT. He can continue to wear to boot as needed. Follow-up with ortho as needed.   Today's evaluation has revealed no signs of a dangerous process. Discussed diagnosis with patient and/or guardian. Patient and/or guardian aware of their diagnosis, possible red flag symptoms to watch out for and need for close follow up. Patient and/or guardian understands verbal and written discharge instructions. Patient and/or guardian comfortable with plan and disposition.  Patient and/or guardian has a clear mental status at this time, good insight into illness (after discussion and teaching) and has clear judgment to make decisions regarding their care  This care was provided during an unprecedented National Emergency due to the Novel Coronavirus (COVID-19) pandemic. COVID-19 infections and transmission risks place heavy strains on healthcare resources.  As this pandemic evolves, our facility, providers, and staff strive to respond fluidly, to remain operational, and to provide care relative to available resources and information. Outcomes are unpredictable and treatments are without well-defined guidelines. Further, the impact of COVID-19 on all aspects of urgent care, including the impact to patients seeking care for reasons other than COVID-19, is unavoidable during this national emergency. At this time of the global pandemic, management of patients has significantly changed, even for non-COVID positive patients given high local and regional COVID volumes at this time requiring high healthcare system and resource utilization. The  standard of care for management of both COVID suspected and non-COVID suspected patients continues to change rapidly at the local, regional, national, and global levels. This patient was worked up and treated to the best available but ever changing evidence and resources available at this current time.   Documentation was completed with the aid of voice recognition software. Transcription may contain typographical errors.  Final Clinical Impressions(s) / UC Diagnoses   Final diagnoses:  Sprain of right foot, initial encounter     Discharge Instructions      The XR of your foot did not show any fractures (breaks) in your foot. RICE treatment (rest, ice, compression and elevation) You can continue wearing your walking boot for the next couple of days if you feel as if it's helpful.  Take medications as needed for pain.      ED Prescriptions     Medication Sig Dispense Auth. Provider   naproxen (NAPROSYN) 500 MG tablet Take 1 tablet (500 mg total) by mouth 2 (two) times daily as needed for mild pain. 30 tablet Lurline Idol, FNP      PDMP not reviewed this encounter.   Lurline Idol, [place] 06/17/21 1242

## 2021-12-25 ENCOUNTER — Telehealth: Payer: Self-pay | Admitting: Physician Assistant

## 2021-12-25 ENCOUNTER — Ambulatory Visit
Admission: EM | Admit: 2021-12-25 | Discharge: 2021-12-25 | Disposition: A | Discharge status: Home / Self Care | Payer: 59 | Attending: Physician Assistant | Admitting: Physician Assistant

## 2021-12-25 ENCOUNTER — Other Ambulatory Visit: Payer: Self-pay

## 2021-12-25 DIAGNOSIS — G629 Polyneuropathy, unspecified: Secondary | ICD-10-CM | POA: Diagnosis not present

## 2021-12-25 MED ORDER — PREDNISONE 20 MG PO TABS: 40.0000 mg | ORAL_TABLET | Freq: Every day | ORAL | 0 refills | Status: AC

## 2021-12-25 NOTE — ED Triage Notes (Signed)
Pt c/o tingling from left elbow down left arm onset ~ 1 month ago. States it happens when he is relaxed sitting down.  ?

## 2021-12-25 NOTE — ED Provider Notes (Signed)
?[name] ? ? ? ?CSN: [id number] ?Arrival date & time: 12/25/21  1731 ? ? ?  ? ?History   ?Chief Complaint ?Chief Complaint  ?Patient presents with  ? left arm pain  ? ? ?HPI ?Joel [name] is a 46 y.o. male.  ? ?Patient here today for evaluation of left arm pain that started about a month ago. He notes that pain is a radiating tingling pain similar to when your arm falls asleep and it radiates from his elbow through his 5th left finger. He denies any weakness. He reports pain can occur with activity and at rest, it is random. He does have known left shoulder issues. He does not report any treatment for symptoms.  ? ?The history is provided by the patient.  ? ?Past Medical History:  ?Diagnosis Date  ? Hyperlipemia   ? Hypertension   ? Sleep apnea   ? ? ?Patient Active Problem List  ? Diagnosis Date Noted  ? Diarrhea 07/11/2020  ? Generalized abdominal pain 07/11/2020  ? Rectal bleeding 07/11/2020  ? Bloating 07/11/2020  ? Hyperlipidemia 04/29/2019  ? Tobacco abuse 04/19/2018  ? Overweight 04/19/2018  ? Hypertension 02/19/2018  ? ? ?Past Surgical History:  ?Procedure Laterality Date  ? FLEXIBLE SIGMOIDOSCOPY  05/08/2011  ? Reason for exam: hematochezia; Findings: internal and external hemorrhoids  ? WISDOM TOOTH EXTRACTION    ? ? ? ? ? ?Home Medications   ? ?Prior to Admission medications   ?Medication Sig Start Date End Date Taking? Authorizing Provider  ?predniSONE (DELTASONE) 20 MG tablet Take 2 tablets (40 mg total) by mouth daily with breakfast for 5 days. 12/25/21 12/30/21 Yes Tomi Bamberger, PA-C  ?amLODipine (NORVASC) 5 MG tablet Take 5 mg daily. 05/14/20   [name], PA  ?atorvastatin (LIPITOR) 20 MG tablet Take 1 tablet (20 mg total) by mouth daily. 07/19/20   [name], PA  ?hyoscyamine (LEVSIN SL) 0.125 MG SL tablet DISSOLVE ONE TABLET UNDER THE TONGUE EVERY 6 HOURS AS NEEDED 12/31/20   Zehr, Princella Pellegrini, PA-C  ?naproxen (NAPROSYN) 500 MG tablet Take 1 tablet (500 mg total) by  mouth 2 (two) times daily as needed for mild pain. 06/17/21   Lurline Idol, FNP  ? ? ?Family History ?Family History  ?Problem Relation Age of Onset  ? Hypertension Mother   ? Alcohol abuse Mother   ? Asthma Mother   ? COPD Mother   ? Drug abuse Mother   ? Heart disease Mother   ? Hypertension Sister   ? Sleep apnea Sister   ? Alcohol abuse Father   ? Drug abuse Father   ? Alcohol abuse Maternal Grandmother   ? Alcohol abuse Maternal Grandfather   ? Drug abuse Maternal Grandfather   ? Diabetes Maternal Grandfather   ? Colon cancer Neg Hx   ? Esophageal cancer Neg Hx   ? Prostate cancer Neg Hx   ? Rectal cancer Neg Hx   ? Stomach cancer Neg Hx   ? ? ?Social History ?Social History  ? ?Tobacco Use  ? Smoking status: Former  ?  Types: Cigarettes  ?  Start date: 04/28/1989  ?  Quit date: 03/29/2019  ?  Years since quitting: 2.7  ? Smokeless tobacco: Never  ?Vaping Use  ? Vaping Use: Never used  ?Substance Use Topics  ? Alcohol use: Yes  ?  Alcohol/week: 3.0 - 4.0 standard drinks  ?  Types: 3 - 4 Standard drinks or equivalent per week  ?  Drug use: Not Currently  ?  Comment: 4 years ago  ? ? ? ?Allergies   ?Patient has no known allergies. ? ? ?Review of Systems ?Review of Systems  ?Constitutional:  Negative for chills and fever.  ?Eyes:  Negative for discharge and redness.  ?Neurological:  Negative for numbness.  ? ? ?Physical Exam ?Triage Vital Signs ?ED Triage Vitals [12/25/21 1925]  ?Enc Vitals Group  ?   BP 122/84  ?   Pulse Rate 81  ?   Resp 18  ?   Temp 97.6 ?F (36.4 ?C)  ?   Temp Source Oral  ?   SpO2 97 %  ?   Weight   ?   Height   ?   Head Circumference   ?   Peak Flow   ?   Pain Score 0  ?   Pain Loc   ?   Pain Edu?   ?   Excl. in GC?   ? ?No data found. ? ?Updated Vital Signs ?BP 122/84 (BP Location: Left Arm)   Pulse 81   Temp 97.6 ?F (36.4 ?C) (Oral)   Resp 18   SpO2 97%  ?   ? ?Physical Exam ?Vitals and nursing note reviewed.  ?Constitutional:   ?   General: He is not in acute distress. ?   Appearance:  Normal appearance. He is not ill-appearing.  ?HENT:  ?   Head: Normocephalic and atraumatic.  ?Eyes:  ?   Conjunctiva/sclera: Conjunctivae normal.  ?Cardiovascular:  ?   Rate and Rhythm: Normal rate.  ?Pulmonary:  ?   Effort: Pulmonary effort is normal.  ?Musculoskeletal:  ?   Comments: Full ROM of left elbow, left wrist, left fingers  ?Neurological:  ?   Mental Status: He is alert.  ?   Comments: Grip strength 5/5 bilaterally  ?Psychiatric:     ?   Mood and Affect: Mood normal.     ?   Behavior: Behavior normal.     ?   Thought Content: Thought content normal.  ? ? ? ?UC Treatments / Results  ?Labs ?(all labs ordered are listed, but only abnormal results are displayed) ?Labs Reviewed - No data to display ? ?EKG ? ? ?Radiology ?No results found. ? ?Procedures ?Procedures (including critical care time) ? ?Medications Ordered in UC ?Medications - No data to display ? ?Initial Impression / Assessment and Plan / UC Course  ?I have reviewed the triage vital signs and the nursing notes. ? ?Pertinent labs & imaging results that were available during my care of the patient were reviewed by me and considered in my medical decision making (see chart for details). ? ?  ?Suspect neuropathy/radiculopathy as cause of symptoms given presentation, specifically in the setting of what sounds to be chronic ipsilateral shoulder issues. Will trial steroid burst and patient has follow up with PCP in a few weeks. Recommended sooner follow up with any concerns.  ? ?Final Clinical Impressions(s) / UC Diagnoses  ? ?Final diagnoses:  ?Neuropathy  ? ?Discharge Instructions   ?None ?  ? ?ED Prescriptions   ? ? Medication Sig Dispense Auth. Provider  ? predniSONE (DELTASONE) 20 MG tablet Take 2 tablets (40 mg total) by mouth daily with breakfast for 5 days. 10 tablet Tomi Bamberger, PA-C  ? ?  ? ?PDMP not reviewed this encounter. ?  ?Tomi Bamberger, PA-C ?12/26/21 [phone] ? ?

## 2021-12-25 NOTE — Telephone Encounter (Signed)
Pt was called at 7:54am- patient was offered to come in today at 9:30am- Patient declined stated he is at work.- Patient stated he can come in after 5.- pt was advised office is closed by that time.- Patient will go to a [facility] after 5 when he leaves work and will call us should he need [place] before. PT declined coming in today.  ? ?Patient ?Name: ?Joel [name] ?Gender: Male ?DOB: 1976-09-23 ?Age: 46 Y 5 M 18 D ?Return ?Phone ?Number: ?[id number] ?(Primary) ?Address: ?City/ ?State/ ?Zip: [place] ? [address] ?[facility] - ?Client ?Health visitor at [facility] ?Provider [name]- PA ?Contact Type Call ?Who Is Calling Patient / Member / Family / Caregiver ?Call Type Triage / Clinical ?Relationship To Patient Self ?Return Phone Number [phone] (Primary) ?Chief Complaint Numbness ?Reason for Call Symptomatic / Request for Health Information ?Initial Comment Has left arm tingling and numbness for the past ?few weeks. ?Translation No ?Nurse Assessment ?Nurse: Lane Hacker, RN, Windy Date/Time [name] Time): 12/24/2021 12:29:33 PM ?Confirm and document reason for call. If ?symptomatic, describe symptoms. ?---Caller c/o left arm tingling and numbness for the ?past few weeks off/on. It is happening now. ?Does the patient have any new or worsening ?symptoms? ---Yes ?Will a triage be completed? ---Yes ?Related visit to physician within the last 2 weeks? ---No ?Does the PT have any chronic conditions? (i.e. ?diabetes, asthma, this includes High risk factors for ?pregnancy, etc.) ?---Yes ?List chronic conditions. ---HTN ?Is this a behavioral health or substance abuse call? ---No ?Guidelines ?Guideline Title Affirmed Question Affirmed Notes Nurse Date/Time (Eastern ?Time) ?Neurologic Deficit [1] Numbness (i.e., ?loss of sensation) of ?the face, arm / hand, ?or leg / foot on one ?side of the body AND ?[2] gradual onset ?(e.g., days to weeks) ?AND  [3] present now ?Lane Hacker, RN, [name] 12/24/2021 12:30:30 ?PM ?PLEASE NOTE: All timestamps contained within this report are represented as [place] Standard Time. ?CONFIDENTIALTY NOTICE: This fax transmission is intended only for the addressee. It contains information that is legally privileged, confidential or ?otherwise protected from use or disclosure. If you are not the intended recipient, you are strictly prohibited from reviewing, disclosing, copying using ?or disseminating any of this information or taking any action in reliance on or regarding this information. If you have received this fax in error, please ?notify us immediately by telephone so that we can arrange for its return to [place]. Phone: [phone], Toll-Free: [phone], Fax: [phone] ?Page: 2 of 2 ?Call Id: [id number] ?Disp. Time (Eastern ?Time) Disposition Final User ?12/24/2021 12:33:02 PM See HCP within 4 Hours (or ?PCP triage) ?Johnston Ebbs, RN, [name] ?Caller Disagree/Comply Comply ?Caller Understands Yes ?PreDisposition Go to Urgent Care/Walk-In Clinic ?Care Advice Given Per Guideline ?SEE HCP (OR PCP TRIAGE) WITHIN 4 HOURS: * IF OFFICE WILL BE OPEN: You need to be seen within the next 3 or 4 ?hours. Call your doctor (or NP/PA) now or as soon as the office opens. CALL BACK IF: * You become worse CARE ADVICE ?given per Neurologic Deficit (Adult) guideline. ?Comments ?User: Rubie Maid, RN Date/Time [name] Time): 12/24/2021 12:31:47 PM ?Right hand cramping off/on. ?Referrals ?REFERRED TO PCP OFFICE ?

## 2021-12-25 NOTE — Telephone Encounter (Signed)
Noted  

## 2021-12-26 ENCOUNTER — Encounter: Payer: Self-pay | Admitting: Physician Assistant

## 2022-01-17 ENCOUNTER — Other Ambulatory Visit (HOSPITAL_COMMUNITY)
Admission: RE | Admit: 2022-01-17 | Discharge: 2022-01-17 | Disposition: A | Discharge status: Home / Self Care | Payer: 59 | Source: Ambulatory Visit | Attending: Physician Assistant | Admitting: Physician Assistant

## 2022-01-17 ENCOUNTER — Encounter: Payer: Self-pay | Admitting: Physician Assistant

## 2022-01-17 ENCOUNTER — Ambulatory Visit (INDEPENDENT_AMBULATORY_CARE_PROVIDER_SITE_OTHER): Payer: 59 | Admitting: Physician Assistant

## 2022-01-17 VITALS — BP 102/80 | HR 74 | Temp 97.9°F | Ht 67.0 in | Wt 184.0 lb

## 2022-01-17 DIAGNOSIS — R1084 Generalized abdominal pain: Secondary | ICD-10-CM

## 2022-01-17 DIAGNOSIS — R202 Paresthesia of skin: Secondary | ICD-10-CM

## 2022-01-17 DIAGNOSIS — Z1159 Encounter for screening for other viral diseases: Secondary | ICD-10-CM

## 2022-01-17 DIAGNOSIS — I10 Essential (primary) hypertension: Secondary | ICD-10-CM

## 2022-01-17 DIAGNOSIS — Z113 Encounter for screening for infections with a predominantly sexual mode of transmission: Secondary | ICD-10-CM | POA: Insufficient documentation

## 2022-01-17 DIAGNOSIS — E785 Hyperlipidemia, unspecified: Secondary | ICD-10-CM

## 2022-01-17 DIAGNOSIS — G4733 Obstructive sleep apnea (adult) (pediatric): Secondary | ICD-10-CM

## 2022-01-17 DIAGNOSIS — Z0001 Encounter for general adult medical examination with abnormal findings: Secondary | ICD-10-CM

## 2022-01-17 LAB — LIPID PANEL
Cholesterol: 202 mg/dL — ABNORMAL HIGH (ref 0–200)
HDL: 44.3 mg/dL (ref >39.00)
LDL Cholesterol: 143 mg/dL — ABNORMAL HIGH (ref 0–99)
NonHDL: 157.53
Total CHOL/HDL Ratio: 5
Triglycerides: 75 mg/dL (ref 0.0–149.0)
VLDL: 15 mg/dL (ref 0.0–40.0)

## 2022-01-17 LAB — CBC WITH DIFFERENTIAL/PLATELET
Basophils Absolute: 0 10*3/uL (ref 0.0–0.1)
Basophils Relative: 0.4 % (ref 0.0–3.0)
Eosinophils Absolute: 0.1 10*3/uL (ref 0.0–0.7)
Eosinophils Relative: 3.3 % (ref 0.0–5.0)
HCT: 42 % (ref 39.0–52.0)
Hemoglobin: 14.1 g/dL (ref 13.0–17.0)
Lymphocytes Relative: 52.3 % — ABNORMAL HIGH (ref 12.0–46.0)
Lymphs Abs: 1.8 10*3/uL (ref 0.7–4.0)
MCHC: 33.5 g/dL (ref 30.0–36.0)
MCV: 95.3 fl (ref 78.0–100.0)
Monocytes Absolute: 0.5 10*3/uL (ref 0.1–1.0)
Monocytes Relative: 14 % — ABNORMAL HIGH (ref 3.0–12.0)
Neutro Abs: 1.1 10*3/uL — ABNORMAL LOW (ref 1.4–7.7)
Neutrophils Relative %: 30 % — ABNORMAL LOW (ref 43.0–77.0)
Platelets: 177 10*3/uL (ref 150.0–400.0)
RBC: 4.4 Mil/uL (ref 4.22–5.81)
RDW: 13.7 % (ref 11.5–15.5)
WBC: 3.5 10*3/uL — ABNORMAL LOW (ref 4.0–10.5)

## 2022-01-17 LAB — COMPREHENSIVE METABOLIC PANEL
ALT: 46 U/L (ref 0–53)
AST: 41 U/L — ABNORMAL HIGH (ref 0–37)
Albumin: 4.2 g/dL (ref 3.5–5.2)
Alkaline Phosphatase: 61 U/L (ref 39–117)
BUN: 13 mg/dL (ref 6–23)
CO2: 29 mEq/L (ref 19–32)
Calcium: 9.5 mg/dL (ref 8.4–10.5)
Chloride: 104 mEq/L (ref 96–112)
Creatinine, Ser: 1.12 mg/dL (ref 0.40–1.50)
GFR: 78.22 mL/min (ref >60.00)
Glucose, Bld: 90 mg/dL (ref 70–99)
Potassium: 4.2 mEq/L (ref 3.5–5.1)
Sodium: 138 mEq/L (ref 135–145)
Total Bilirubin: 0.5 mg/dL (ref 0.2–1.2)
Total Protein: 7.1 g/dL (ref 6.0–8.3)

## 2022-01-17 MED ORDER — ATORVASTATIN CALCIUM 20 MG PO TABS: 20.0000 mg | ORAL_TABLET | Freq: Every day | ORAL | 1 refills | Status: DC

## 2022-01-17 MED ORDER — HYOSCYAMINE SULFATE 0.125 MG SL SUBL: SUBLINGUAL_TABLET | SUBLINGUAL | 1 refills | Status: DC

## 2022-01-17 NOTE — Patient Instructions (Addendum)
It was great to see you! ? ?Refill of lipitor and levsin today ? ?We cleaned out your left ear today ? ?Referral to sports medicine today about your arm and shoulder ?Someone from there office will be in touch soon regarding your appointment with him. ?His location: [place] at [facility] [address] on the 1st floor.   ?Phone number [phone], Fax [phone].  ?This location is across the street from the entrance to [employer] and in the same complex as the [facility] and [facility] ? ?Please go to the lab for blood work.  ? ?Our office will call you with your results unless you have chosen to receive results via MyChart. ? ?If your blood work is normal we will follow-up each year for physicals and as scheduled for chronic medical problems. ? ?If anything is abnormal we will treat accordingly and get you in for a follow-up. ? ?Take care, ? ?Aldona Bar ?  ? ? ?

## 2022-01-17 NOTE — Progress Notes (Signed)
? ?Subjective:  ?  ?Joel [name] [place] is a 46 y.o. male and is here for a comprehensive physical exam. ? ?HPI ? ?Health Maintenance Due  ?Topic Date Due  ? COVID-19 Vaccine (1) Never done  ? Hepatitis C Screening  Never done  ? ?Acute Concerns: ?Left Arm Neuropathy ?On 12/25/21, Joel [name] presented to the UC with c/o sporadic left arm pain that had been onset for a month. Pt describes pain as a tingling sensation similar to your arm falling asleep that radiates from his elbow to his hand. Denied weakness. Upon further discussion, pt did admit he has a hx of left shoulder issues. Following this pt was prescribed prednisone 40 mg daily x 5 days as well as recommended to follow up with PCP for further evaluation.  ? ?While taking the medication, he did notice his pain subsided a bit, but the tingling sensation stayed. At this time he admits, he does still experience some left shoulder pain upon moving it in certain positions. Denies weakness or neck pain.  ? ?Chronic Issues: ?HTN ?[name] was previously taking amlodipine 5 mg daily, but has since stopped used several months ago due to noticing he wasn't getting anymore refills from the pharmacy. At home blood pressure readings are: not checked. Patient denies chest pain, SOB, blurred vision, dizziness, unusual headaches, lower leg swelling.Denies excessive caffeine intake, stimulant usage, excessive alcohol intake, or increase in salt consumption. ? ?BP Readings from Last 3 Encounters:  ?01/17/22 102/80  ?12/25/21 122/84  ?06/17/21 (!) 147/88  ? ?HLD  ?Although pt was previously compliant with taking lipitor 20 mg daily with no adverse effects, he admits he has not been taking this medication for several months. Pt shares reasoning of not taking the medication as being that it was no longer being refilled and he would forget to fix this. Besides this, he is interested in restarting this medication if needed. Denies CP or SOB.  ? ?Sleep Apnea  ?Pt does have access to  a CPAP but admits he doesn't use it nightly like he should. States there are nights he would fall asleep without it on. Despite this he is managing well.  ? ?Abdominal pain ?Back in 2021, pt was experiencing abdominal bloating, rectal bleeding, and chronic diarrhea. Due to sx, he was referred to Dr. Loletha Carrow, gastroenterology and had a colonoscopy completed in November of 2021. Despite finding mild diverticula, internal hemorrhoids, and prolapsed hypertrophied anal papilla the exam was unremarkable.  ? ?Although pt is no longer experiencing rectal bleeding and chronic diarrhea, he does occasionally have abdominal bloating. At this time he would like a refill of the levsin SL 0.125 mg due to finding this resolved his sx. Currently he is managing well.  ? ? 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 ?Body mass index is  28.82 kg/m?. ?Mood -- No concerns ?Tobacco use --  ?Tobacco Use: Medium Risk  ? Smoking Tobacco Use: Former  ? Smokeless Tobacco Use: Never  ? Passive Exposure: Not on file  ?  ?Alcohol use ---  reports current alcohol use of about 3.0 - 4.0 standard drinks per week.  ? ? ?  01/17/2022  ? 10:00 AM  ?Depression screen PHQ 2/9  ?Decreased Interest 0  ?Down, Depressed, Hopeless 0  ?PHQ - 2 Score 0  ?Altered sleeping 0  ?Tired, decreased energy 0  ?Change in appetite 0  ?Feeling bad  or failure about yourself  0  ?Trouble concentrating 0  ?Moving slowly or fidgety/restless 0  ?Suicidal thoughts 0  ?PHQ-9 Score 0  ?Difficult doing work/chores Not difficult at all  ? ? ? ?Other providers/specialists: ?Patient Care Team: ?Inda Coke, PA as PCP - General (Physician Assistant) ? ? ?PMHx, SurgHx, SocialHx, Medications, and Allergies were reviewed in the Visit Navigator and updated as appropriate.  ? ?Past Medical History:  ?Diagnosis Date  ? Hyperlipemia   ? Hypertension   ? Sleep apnea   ? ? ? ?Past Surgical History:  ?Procedure Laterality Date  ? FLEXIBLE SIGMOIDOSCOPY  05/08/2011  ? Reason for exam: hematochezia; Findings: internal and external hemorrhoids  ? WISDOM TOOTH EXTRACTION    ? ? ? ?Family History  ?Problem Relation Age of Onset  ? Hypertension Mother   ? Alcohol abuse Mother   ? Asthma Mother   ? COPD Mother   ? Drug abuse Mother   ? Heart disease Mother   ? Hypertension Sister   ? Sleep apnea Sister   ? Alcohol abuse Father   ? Drug abuse Father   ? Alcohol abuse Maternal Grandmother   ? Alcohol abuse Maternal Grandfather   ? Drug abuse Maternal Grandfather   ? Diabetes Maternal Grandfather   ? Colon cancer Neg Hx   ? Esophageal cancer Neg Hx   ? Prostate cancer Neg Hx   ? Rectal cancer Neg Hx   ? Stomach cancer Neg Hx   ? ? ?Social History  ? ?Tobacco Use  ? Smoking status: Former  ?  Types: Cigarettes  ?  Start date: 04/28/1989  ?  Quit date: 03/29/2019  ?  Years since quitting: 2.8  ? Smokeless tobacco: Never  ?Vaping Use  ? Vaping Use: Never used  ?Substance Use Topics  ? Alcohol use: Yes  ?  Alcohol/week: 3.0 - 4.0 standard drinks  ?  Types: 3 - 4 Standard drinks or equivalent per week  ? Drug use: Not Currently  ?  Comment: 4 years ago  ? ? ?Review of Systems:  ? ?Review of Systems  ?Constitutional:  Negative for chills, fever, malaise/fatigue and weight loss.  ?HENT:  Negative for hearing loss, sinus pain and sore throat.   ?Respiratory:  Negative for cough and hemoptysis.    ?Cardiovascular:  Negative for chest pain, palpitations, leg swelling and PND.  ?Gastrointestinal:  Negative for abdominal pain, constipation, diarrhea, heartburn, nausea and vomiting.  ?Genitourinary:  Negative for dysuria, frequency and urgency.  ?Musculoskeletal:  Negative for back pain, myalgias and neck pain.  ?Skin:  Negative for itching and rash.  ?Neurological:  Negative for dizziness, tingling, seizures and headaches.  ?Endo/Heme/Allergies:  Negative for polydipsia.  ?Psychiatric/Behavioral:  Negative for depression. The patient is not nervous/anxious.   ? ?Objective:  ? ?Vitals:  ? 01/17/22 1001  ?BP: 102/80  ?Pulse: 74  ?Temp: 97.9 ?F (36.6 ?C)  ?  SpO2: 98%  ? ?Body mass index is 28.82 kg/m?. ? ?General Appearance:  Alert, cooperative, no distress, appears stated age  ?Head:  Normocephalic, without obvious abnormality, atraumatic  ?Eyes:  PERRL, conjunctiva/corneas clear, EOM's intact, fundi benign, both eyes       ?Ears:  Normal TM's and external ear canals, both ears  ?Nose: Nares normal, septum midline, mucosa normal, no drainage    or sinus tenderness  ?Throat: Lips, mucosa, and tongue normal; teeth and gums normal  ?Neck: Supple, symmetrical, trachea midline, no adenopathy; thyroid:  No enlargement/tenderness/nodules; no carotit bruit or JVD  ?Back:   Symmetric, no curvature, ROM normal, no CVA tenderness  ?Lungs:   Clear to auscultation bilaterally, respirations unlabored  ?Chest wall:  No tenderness or deformity  ?Heart:  Regular rate and rhythm, S1 and S2 normal, no murmur, rub   or gallop  ?Abdomen:   Soft, non-tender, bowel sounds active all four quadrants, no masses, no organomegaly  ?Extremities: Extremities normal, atraumatic, no cyanosis or edema  ?Prostate: Not done.   ?Skin: Skin color, texture, turgor normal, no rashes or lesions  ?Lymph nodes: Cervical, supraclavicular, and axillary nodes normal  ?Neurologic: CNII-XII grossly intact. Normal strength, sensation and reflexes throughout   ? ? ?Assessment/Plan:  ? ?Routine physical examination with abnormal findings ?Today patient counseled on age appropriate routine health concerns for screening and prevention, each reviewed and up to date

## 2022-01-20 ENCOUNTER — Other Ambulatory Visit: Payer: Self-pay | Admitting: Physician Assistant

## 2022-01-20 DIAGNOSIS — D709 Neutropenia, unspecified: Secondary | ICD-10-CM

## 2022-01-20 LAB — URINE CYTOLOGY ANCILLARY ONLY
Chlamydia: NEGATIVE
Comment: NEGATIVE
Comment: NEGATIVE
Comment: NORMAL
Neisseria Gonorrhea: NEGATIVE
Trichomonas: NEGATIVE

## 2022-01-20 LAB — HIV ANTIBODY (ROUTINE TESTING W REFLEX): HIV 1&2 Ab, 4th Generation: NONREACTIVE

## 2022-01-20 LAB — HEPATITIS C ANTIBODY
Hepatitis C Ab: NONREACTIVE
SIGNAL TO CUT-OFF: <0.02 (ref <1.00)

## 2022-01-20 LAB — RPR: RPR Ser Ql: NONREACTIVE

## 2022-01-22 ENCOUNTER — Other Ambulatory Visit (INDEPENDENT_AMBULATORY_CARE_PROVIDER_SITE_OTHER): Payer: 59

## 2022-01-22 DIAGNOSIS — D709 Neutropenia, unspecified: Secondary | ICD-10-CM | POA: Diagnosis not present

## 2022-01-22 NOTE — Progress Notes (Signed)
? ? ?Subjective:   ? ?CC: L arm pain and paresthesias ? ?HPI: Pt is a 46 y/o male presenting w/ c/o L arm pain and paresthesias x approximately one month w/ no known MOI.  He locates his symptoms to Medial elbow to the finger on the left hand, patient states he did have pain in the elbow but the prednisone has since stopped that pain. Patient states that the numbness has not gone away at all.  He has been seen at the [facility] and by his PCP for this issue. ? ?He has had trials of conservative management for this issue since March 8.  He had a trial of oral steroids which helped a little.  He notes that he is dropping some objects and notes a little bit of weakness with his left hand. ? ?Radiating pain: yes from elbow to his finger on the left hand.  ?Neck pain:no ?L UE paresthesias: yes ?Aggravating factors: always hurts and tingles no matter what he is doing  ?Treatments tried: prednisone;  ? ?Pertinent review of Systems: No fevers or chills ? ?Relevant historical information: Hypertension ? ? ?Objective:   ? ?Vitals:  ? 01/23/22 1025  ?BP: 122/90  ?Pulse: 81  ?SpO2: 97%  ? ?General: Well Developed, well nourished, and in no acute distress.  ? ?MSK: C-spine: Normal-appearing ?Nontender midline. ?Normal cervical motion. ?Positive Spurling's test left side. ?Reflexes are intact. ?Strength minutes grip strength left hand. ?Sensation is diminished slightly to light touch left hand digits 2-5. ? ?Left hand and wrist normal-appearing ?Normal wrist and hand motion. ?Negative Tinel's test at the left carpal tunnel and left cubital tunnel. ?Negative Phalen's test. ? ?Lab and Radiology Results ? 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  No acute fractures are visible. ?Await formal radiology review ? ? ?Impression and Recommendations:   ? ?Assessment and Plan: ?46 y.o. male with left arm paresthesias with mild weakness.  This has been  ongoing for several months.  He was seen in urgent care about a month ago where he was given a trial of oral prednisone which helped a little but not as much as we would like.  Physical exam evaluation today is concerning for cervical radiculopathy at C7 and C8.  Simultaneous carpal tunnel and cubital tunnel syndrome is less likely based on his physical exam but is on the differential. ?Based on his chronic ongoing symptoms for several months that are now progressive and associated with mild weakness we will proceed with x-ray and MRI for potential epidural steroid injection planning and to further evaluate and proceed with the diagnosis.  Anticipate ordering an epidural steroid injection based on the MRI. ?If the MRI is not diagnostic would recommend nerve conduction study as the next step. ? ?Limited gabapentin mostly at bedtime for symptom control. ? ?PDMP not reviewed this encounter. ?Orders Placed This Encounter  ?Procedures  ? MR CERVICAL SPINE WO CONTRAST  ?  Standing Status:   Future  ?  Standing Expiration Date:   01/24/2023  ?  Order Specific Question:   What is the patient's sedation requirement?  ?  Answer:   No Sedation  ?  Order Specific Question:   Does the patient have a pacemaker or implanted devices?  ?  Answer:   No  ?  Order Specific Question:   Preferred imaging location?  ?  Answer:   [facility] (table limit-350lbs)  ? DG Cervical Spine 2 or 3  views  ?  Standing Status:   Future  ?  Number of Occurrences:   1  ?  Standing Expiration Date:   01/24/2023  ?  Order Specific Question:   Reason for Exam (SYMPTOM  OR DIAGNOSIS REQUIRED)  ?  Answer:   cervical radiculopathy left arm  ?  Order Specific Question:   Preferred imaging location?  ?  Answer:   [facility]  ? ?Meds ordered this encounter  ?Medications  ? gabapentin (NEURONTIN) 300 MG capsule  ?  Sig: Take 1 capsule (300 mg total) by mouth 3 (three) times daily as needed.  ?  Dispense:  90 capsule  ?  Refill:  3   ? ? ?Discussed warning signs or symptoms. Please see discharge instructions. Patient expresses understanding. ? ? ?The above documentation has been reviewed and is accurate and complete Clementeen Graham, M.D. ? ?

## 2022-01-22 NOTE — Addendum Note (Signed)
Addended by: Laddie Aquas A on: 01/22/2022 03:23 PM ? ? Modules accepted: Orders ? ?

## 2022-01-23 ENCOUNTER — Ambulatory Visit (INDEPENDENT_AMBULATORY_CARE_PROVIDER_SITE_OTHER): Payer: 59

## 2022-01-23 ENCOUNTER — Encounter: Payer: Self-pay | Admitting: Physician Assistant

## 2022-01-23 ENCOUNTER — Ambulatory Visit: Payer: Self-pay

## 2022-01-23 ENCOUNTER — Ambulatory Visit (INDEPENDENT_AMBULATORY_CARE_PROVIDER_SITE_OTHER): Payer: 59 | Admitting: Family Medicine

## 2022-01-23 VITALS — BP 122/90 | HR 81 | Ht 67.0 in | Wt 180.0 lb

## 2022-01-23 DIAGNOSIS — D709 Neutropenia, unspecified: Secondary | ICD-10-CM

## 2022-01-23 DIAGNOSIS — M79602 Pain in left arm: Secondary | ICD-10-CM

## 2022-01-23 DIAGNOSIS — M5412 Radiculopathy, cervical region: Secondary | ICD-10-CM | POA: Diagnosis not present

## 2022-01-23 LAB — CBC WITH DIFFERENTIAL/PLATELET
Absolute Monocytes: 496 cells/uL (ref 200–950)
Basophils Absolute: 11 cells/uL (ref 0–200)
Basophils Relative: 0.3 %
Eosinophils Absolute: 141 cells/uL (ref 15–500)
Eosinophils Relative: 3.8 %
HCT: 40.4 % (ref 38.5–50.0)
Hemoglobin: 13.7 g/dL (ref 13.2–17.1)
Lymphs Abs: 2216 cells/uL (ref 850–3900)
MCH: 31.4 pg (ref 27.0–33.0)
MCHC: 33.9 g/dL (ref 32.0–36.0)
MCV: 92.7 fL (ref 80.0–100.0)
MPV: 12 fL (ref 7.5–12.5)
Monocytes Relative: 13.4 %
Neutro Abs: 836 cells/uL — ABNORMAL LOW (ref 1500–7800)
Neutrophils Relative %: 22.6 %
Platelets: 168 10*3/uL (ref 140–400)
RBC: 4.36 10*6/uL (ref 4.20–5.80)
RDW: 13.1 % (ref 11.0–15.0)
Total Lymphocyte: 59.9 %
WBC: 3.7 10*3/uL — ABNORMAL LOW (ref 3.8–10.8)

## 2022-01-23 LAB — PATHOLOGIST SMEAR REVIEW

## 2022-01-23 MED ORDER — GABAPENTIN 300 MG PO CAPS: 300.0000 mg | ORAL_CAPSULE | Freq: Three times a day (TID) | ORAL | 3 refills | Status: AC | PRN

## 2022-01-23 NOTE — Patient Instructions (Addendum)
Good to see you ?Gabapentin sent to pharmacy ?MRI ordered they will call you to schedule ?Follow up as needed  ?

## 2022-01-25 ENCOUNTER — Ambulatory Visit (INDEPENDENT_AMBULATORY_CARE_PROVIDER_SITE_OTHER): Payer: 59

## 2022-01-25 DIAGNOSIS — M79602 Pain in left arm: Secondary | ICD-10-CM

## 2022-01-25 DIAGNOSIS — M5412 Radiculopathy, cervical region: Secondary | ICD-10-CM

## 2022-01-28 ENCOUNTER — Telehealth: Payer: Self-pay | Admitting: Family Medicine

## 2022-01-28 DIAGNOSIS — M5412 Radiculopathy, cervical region: Secondary | ICD-10-CM

## 2022-01-28 DIAGNOSIS — M79602 Pain in left arm: Secondary | ICD-10-CM

## 2022-01-28 NOTE — Progress Notes (Signed)
Cervical spine x-ray shows no fractures.  Medium arthritis is present throughout the spine.

## 2022-01-28 NOTE — Telephone Encounter (Signed)
Epidural steroid injection ordered 

## 2022-01-28 NOTE — Progress Notes (Signed)
MRI of the cervical spine shows potential pinched nerves at multiple levels.  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  This could give you symptoms exactly like you have. ?Next step is an epidural steroid injection.  I have ordered this. ?Please contact [facility] at [phone] or [phone] to schedule

## 2022-01-29 ENCOUNTER — Telehealth: Payer: Self-pay | Admitting: Hematology

## 2022-01-29 NOTE — Telephone Encounter (Signed)
Scheduled appt per 4/10 referral. Pt is aware of appt date and time. Pt is aware to arrive 15 mins prior to appt time and to bring and updated insurance card. Pt is aware of appt location.   ?

## 2022-02-06 ENCOUNTER — Ambulatory Visit
Admission: RE | Admit: 2022-02-06 | Discharge: 2022-02-06 | Disposition: A | Discharge status: Home / Self Care | Payer: 59 | Source: Ambulatory Visit | Attending: Family Medicine | Admitting: Family Medicine

## 2022-02-06 DIAGNOSIS — M79602 Pain in left arm: Secondary | ICD-10-CM

## 2022-02-06 DIAGNOSIS — M5412 Radiculopathy, cervical region: Secondary | ICD-10-CM

## 2022-02-06 MED ORDER — TRIAMCINOLONE ACETONIDE 40 MG/ML IJ SUSP (RADIOLOGY)
60.0000 mg | Freq: Once | INTRAMUSCULAR | Status: AC
  Administered 2022-02-06: 60 mg via EPIDURAL

## 2022-02-06 MED ORDER — IOPAMIDOL (ISOVUE-M 300) INJECTION 61%
1.0000 mL | Freq: Once | INTRAMUSCULAR | Status: AC | PRN
  Administered 2022-02-06: 1 mL via EPIDURAL

## 2022-02-06 NOTE — Discharge Instructions (Signed)

## 2022-02-19 ENCOUNTER — Other Ambulatory Visit: Payer: Self-pay

## 2022-02-19 ENCOUNTER — Inpatient Hospital Stay: Payer: 59

## 2022-02-19 ENCOUNTER — Inpatient Hospital Stay: Payer: 59 | Attending: Hematology | Admitting: Hematology

## 2022-02-19 VITALS — BP 131/88 | HR 84 | Temp 98.4°F | Resp 18 | Ht 67.0 in | Wt 181.6 lb

## 2022-02-19 DIAGNOSIS — Z79899 Other long term (current) drug therapy: Secondary | ICD-10-CM | POA: Diagnosis not present

## 2022-02-19 DIAGNOSIS — Z836 Family history of other diseases of the respiratory system: Secondary | ICD-10-CM | POA: Insufficient documentation

## 2022-02-19 DIAGNOSIS — Z7289 Other problems related to lifestyle: Secondary | ICD-10-CM | POA: Diagnosis not present

## 2022-02-19 DIAGNOSIS — Z87891 Personal history of nicotine dependence: Secondary | ICD-10-CM | POA: Diagnosis not present

## 2022-02-19 DIAGNOSIS — K648 Other hemorrhoids: Secondary | ICD-10-CM | POA: Diagnosis not present

## 2022-02-19 DIAGNOSIS — Z811 Family history of alcohol abuse and dependence: Secondary | ICD-10-CM | POA: Diagnosis not present

## 2022-02-19 DIAGNOSIS — Z833 Family history of diabetes mellitus: Secondary | ICD-10-CM | POA: Insufficient documentation

## 2022-02-19 DIAGNOSIS — K644 Residual hemorrhoidal skin tags: Secondary | ICD-10-CM | POA: Insufficient documentation

## 2022-02-19 DIAGNOSIS — Z814 Family history of other substance abuse and dependence: Secondary | ICD-10-CM | POA: Insufficient documentation

## 2022-02-19 DIAGNOSIS — D709 Neutropenia, unspecified: Secondary | ICD-10-CM | POA: Diagnosis present

## 2022-02-19 DIAGNOSIS — I10 Essential (primary) hypertension: Secondary | ICD-10-CM | POA: Insufficient documentation

## 2022-02-19 DIAGNOSIS — E785 Hyperlipidemia, unspecified: Secondary | ICD-10-CM | POA: Insufficient documentation

## 2022-02-19 DIAGNOSIS — G4730 Sleep apnea, unspecified: Secondary | ICD-10-CM | POA: Diagnosis not present

## 2022-02-19 DIAGNOSIS — Z8249 Family history of ischemic heart disease and other diseases of the circulatory system: Secondary | ICD-10-CM | POA: Diagnosis not present

## 2022-02-19 LAB — CMP (CANCER CENTER ONLY)
ALT: 43 U/L (ref 0–44)
AST: 39 U/L (ref 15–41)
Albumin: 4.4 g/dL (ref 3.5–5.0)
Alkaline Phosphatase: 75 U/L (ref 38–126)
Anion gap: 6 (ref 5–15)
BUN: 13 mg/dL (ref 6–20)
CO2: 31 mmol/L (ref 22–32)
Calcium: 9.6 mg/dL (ref 8.9–10.3)
Chloride: 103 mmol/L (ref 98–111)
Creatinine: 1.19 mg/dL (ref 0.61–1.24)
GFR, Estimated: >60 mL/min (ref >60)
Glucose, Bld: 101 mg/dL — ABNORMAL HIGH (ref 70–99)
Potassium: 4.2 mmol/L (ref 3.5–5.1)
Sodium: 140 mmol/L (ref 135–145)
Total Bilirubin: 0.6 mg/dL (ref 0.3–1.2)
Total Protein: 7.7 g/dL (ref 6.5–8.1)

## 2022-02-19 LAB — CBC WITH DIFFERENTIAL/PLATELET
Abs Immature Granulocytes: 0 10*3/uL (ref 0.00–0.07)
Basophils Absolute: 0 10*3/uL (ref 0.0–0.1)
Basophils Relative: 1 %
Eosinophils Absolute: 0.1 10*3/uL (ref 0.0–0.5)
Eosinophils Relative: 2 %
HCT: 44.1 % (ref 39.0–52.0)
Hemoglobin: 15.1 g/dL (ref 13.0–17.0)
Immature Granulocytes: 0 %
Lymphocytes Relative: 50 %
Lymphs Abs: 2.8 10*3/uL (ref 0.7–4.0)
MCH: 31.8 pg (ref 26.0–34.0)
MCHC: 34.2 g/dL (ref 30.0–36.0)
MCV: 92.8 fL (ref 80.0–100.0)
Monocytes Absolute: 0.5 10*3/uL (ref 0.1–1.0)
Monocytes Relative: 10 %
Neutro Abs: 2 10*3/uL (ref 1.7–7.7)
Neutrophils Relative %: 37 %
Platelets: 210 10*3/uL (ref 150–400)
RBC: 4.75 MIL/uL (ref 4.22–5.81)
RDW: 12.7 % (ref 11.5–15.5)
WBC: 5.4 10*3/uL (ref 4.0–10.5)
nRBC: 0 % (ref 0.0–0.2)

## 2022-02-19 LAB — LACTATE DEHYDROGENASE: LDH: 175 U/L (ref 98–192)

## 2022-02-19 LAB — VITAMIN B12: Vitamin B-12: 377 pg/mL (ref 180–914)

## 2022-02-19 NOTE — Progress Notes (Signed)
. ? ? ?HEMATOLOGY/ONCOLOGY CONSULTATION NOTE ? ?Date of Service: 02/19/2022 ? ?Patient Care Team: ?Jarold MottoWorley, Samantha, [place] as PCP - General (Physician Assistant) ? ?CHIEF COMPLAINTS/PURPOSE OF CONSULTATION:  ?Neutropenia ? ? ?HISTORY OF PRESENTING ILLNESS:  ?Joel [name] is a wonderful 46 y.o. male who has been referred to us by Dr .Bufford ButtnerWorley, Lelon MastSamantha, PA ? for evaluation and management of neutropenia. ? ?Patient has a history of hypertension, dyslipidemia, sleep apnea and had routine labs with his primary care physician on 01/22/2022 with a CBC showing normal hemoglobin of 13.7 with an MCV of 92.7, normal platelets of 186k and WBC count of 3.7k with neutrophil count of 836. ?Pathologist read of the patient's smear showed leukopenia with neutropenia with no immature cells noted. ?Patient notes no fevers no chills no night sweats.  No weight loss.  No other new focal symptoms.  No issues with frequent infections.  No recent viral infections or vaccinations.  No other new medications. ? ?Patient denies any other drug use currently.  No cocaine use.  No significant alcohol use. ?MEDICAL HISTORY:  ?Past Medical History:  ?Diagnosis Date  ? Hyperlipemia   ? Hypertension   ? Sleep apnea   ? ? ?SURGICAL HISTORY: ?Past Surgical History:  ?Procedure Laterality Date  ? FLEXIBLE SIGMOIDOSCOPY  05/08/2011  ? Reason for exam: hematochezia; Findings: internal and external hemorrhoids  ? WISDOM TOOTH EXTRACTION    ? ? ?SOCIAL HISTORY: ?Social History  ? ?Socioeconomic History  ? Marital status: Single  ?  Spouse name: Not on file  ? Number of children: 1  ? Years of education: Not on file  ? Highest education level: Not on file  ?Occupational History  ? Not on file  ?Tobacco Use  ? Smoking status: Former  ?  Types: Cigarettes  ?  Start date: 04/28/1989  ?  Quit date: 03/29/2019  ?  Years since quitting: 2.8  ? Smokeless tobacco: Never  ?Vaping Use  ? Vaping Use: Never used  ?Substance and Sexual Activity  ? Alcohol use: Yes  ?   Alcohol/week: 3.0 - 4.0 standard drinks  ?  Types: 3 - 4 Standard drinks or equivalent per week  ? Drug use: Not Currently  ?  Comment: 4 years ago  ? Sexual activity: Yes  ?Other Topics Concern  ? Not on file  ?Social History Narrative  ? [place] [employer] [place]  ? 1 daughter -- 48 y/o  ? Not married  ? ?Social Determinants of Health  ? ?Financial Resource Strain: Not on file  ?Food Insecurity: Not on file  ?Transportation Needs: Not on file  ?Physical Activity: Not on file  ?Stress: Not on file  ?Social Connections: Not on file  ?Intimate Partner Violence: Not on file  ? ? ?FAMILY HISTORY: ?Family History  ?Problem Relation Age of Onset  ? Hypertension Mother   ? Alcohol abuse Mother   ? Asthma Mother   ? COPD Mother   ? Drug abuse Mother   ? Heart disease Mother   ? Hypertension Sister   ? Sleep apnea Sister   ? Alcohol abuse Father   ? Drug abuse Father   ? Alcohol abuse Maternal Grandmother   ? Alcohol abuse Maternal Grandfather   ? Drug abuse Maternal Grandfather   ? Diabetes Maternal Grandfather   ? Colon cancer Neg Hx   ? Esophageal cancer Neg Hx   ? Prostate cancer Neg Hx   ? Rectal cancer Neg Hx   ? Stomach cancer  Neg Hx   ? ? ?ALLERGIES:  has No Known Allergies. ? ?MEDICATIONS:  ?Current Outpatient Medications  ?Medication Sig Dispense Refill  ? atorvastatin (LIPITOR) 20 MG tablet Take 1 tablet (20 mg total) by mouth daily. 90 tablet 1  ? gabapentin (NEURONTIN) 300 MG capsule Take 1 capsule (300 mg total) by mouth 3 (three) times daily as needed. 90 capsule 3  ? hyoscyamine (LEVSIN SL) 0.125 MG SL tablet DISSOLVE ONE TABLET UNDER THE TONGUE EVERY 6 HOURS AS NEEDED 30 tablet 1  ? IBU 400 MG tablet SMARTSIG:1-2 Tablet(s) By Mouth 3-4 Times Daily PRN    ? naproxen (NAPROSYN) 500 MG tablet Take 1 tablet (500 mg total) by mouth 2 (two) times daily as needed for mild pain. 30 tablet 0  ? ?No current facility-administered medications for this visit.  ? ? ?REVIEW OF SYSTEMS:   ? ?10 Point review of Systems  was done is negative except as noted above. ? ?PHYSICAL EXAMINATION: ?ECOG PERFORMANCE STATUS: 1 - Symptomatic but completely ambulatory ? ?. ?Vitals:  ? 02/19/22 1059  ?BP: 131/88  ?Pulse: 84  ?Resp: 18  ?Temp: 98.4 ?F (36.9 ?C)  ?SpO2: 100%  ? ?Filed Weights  ? 02/19/22 1059  ?Weight: 181 lb 9.6 oz (82.4 kg)  ? ?.Body mass index is 28.44 kg/m?. ? ?GENERAL:alert, in no acute distress and comfortable ?SKIN: no acute rashes, no significant lesions ?EYES: conjunctiva are pink and non-injected, sclera anicteric ?OROPHARYNX: MMM, no exudates, no oropharyngeal erythema or ulceration ?NECK: supple, no JVD ?LYMPH:  no palpable lymphadenopathy in the cervical, axillary or inguinal regions ?LUNGS: clear to auscultation b/l with normal respiratory effort ?HEART: regular rate & rhythm ?ABDOMEN:  normoactive bowel sounds , non tender, not distended.  No palpable hepatosplenomegaly ?Extremity: no pedal edema ?PSYCH: alert & oriented x 3 with fluent speech ?NEURO: no focal motor/sensory deficits ? ?LABORATORY DATA:  ?I have reviewed the data as listed ? ?. ? ?  Latest Ref Rng & Units 01/22/2022  ?  3:23 PM 01/17/2022  ? 10:46 AM 05/18/2020  ?  4:06 PM  ?CBC  ?WBC 3.8 - 10.8 Thousand/uL 3.7   3.5   4.0    ?Hemoglobin 13.2 - 17.1 g/dL 41.3   24.4   01.0    ?Hematocrit 38.5 - 50.0 % 40.4   42.0   42.3    ?Platelets 140 - 400 Thousand/uL 168   177.0   212    ?ANC 836 ? ?. ? ?  Latest Ref Rng & Units 01/17/2022  ? 10:46 AM 06/07/2020  ?  3:48 PM 05/18/2020  ?  4:06 PM  ?CMP  ?Glucose 70 - 99 mg/dL 90    96    ?BUN 6 - 23 mg/dL 13    14    ?Creatinine 0.40 - 1.50 mg/dL 2.72    5.36    ?Sodium 135 - 145 mEq/L 138    138    ?Potassium 3.5 - 5.1 mEq/L 4.2    4.7    ?Chloride 96 - 112 mEq/L 104    104    ?CO2 19 - 32 mEq/L 29    29    ?Calcium 8.4 - 10.5 mg/dL 9.5    9.2    ?Total Protein 6.0 - 8.3 g/dL 7.1   8.1   7.3    ?Total Bilirubin 0.2 - 1.2 mg/dL 0.5   0.5   0.6    ?Alkaline Phos 39 - 117 U/L 61      ?  AST 0 - 37 U/L 41   32   54    ?ALT 0  - 53 U/L 46   32   47    ? ? ? ?RADIOGRAPHIC STUDIES: ?I have personally reviewed the radiological images as listed and agreed with the findings in the report. ? ?ASSESSMENT & PLAN:  ? ?46 year old male with history of hypertension, dyslipidemia, sleep apnea with  ? ?#1 Isolated neutropenia. ?With available labs were discussed in detail.Marland Kitchen ?Patient has had no issues with frequent or severe infections. ?Plan ? ?-We discussed multiple etiologies of isolated neutropenia including benign ethnic neutropenia, vitamin deficiencies, medications, or treatment consideration etc. ?-Limited work-up today with labs as noted below ? ?CBC shows normalization of WBC count with a WBC count of 5.4k hemoglobin of 15.1 and platelets of 210k ?CMP within normal limits ?LDH within normal limits at 175 ?B12 317 ? ? ?Orders Placed This Encounter  ?Procedures  ? CBC with Differential/Platelet  ?  Standing Status:   Future  ?  Number of Occurrences:   1  ?  Standing Expiration Date:   02/20/2023  ? CMP (Cancer Center only)  ?  Standing Status:   Future  ?  Number of Occurrences:   1  ?  Standing Expiration Date:   02/20/2023  ? Lactate dehydrogenase  ?  Standing Status:   Future  ?  Number of Occurrences:   1  ?  Standing Expiration Date:   02/20/2023  ? Vitamin B12  ?  Standing Status:   Future  ?  Number of Occurrences:   1  ?  Standing Expiration Date:   02/19/2023  ? Copper, serum  ?  Standing Status:   Future  ?  Number of Occurrences:   1  ?  Standing Expiration Date:   02/20/2023  ? Folate RBC  ?  Standing Status:   Future  ?  Number of Occurrences:   1  ?  Standing Expiration Date:   02/20/2023  ? ANA, IFA (with reflex)  ?  Standing Status:   Future  ?  Number of Occurrences:   1  ?  Standing Expiration Date:   02/19/2023  ? ? ?Follow-up ?Labs today ?Return to clinic with Dr. Candise Che with labs in 6 months ? ?The total time spent in the appointment was 45 minutes*. ? ?All of the patient's questions were answered with apparent satisfaction. The patient  knows to call the clinic with any problems, questions or concerns. ? ? ?Wyvonnia Lora MD MS AAHIVMS [facility] ?Hematology/Oncology Physician ?[facility] ? ?.*Total Encounter Time as defined by the

## 2022-02-20 LAB — FOLATE RBC
Folate, Hemolysate: 414 ng/mL
Folate, RBC: 943 ng/mL (ref >498)
Hematocrit: 43.9 % (ref 37.5–51.0)

## 2022-02-20 LAB — ANTINUCLEAR ANTIBODIES, IFA: ANA Ab, IFA: NEGATIVE

## 2022-02-21 LAB — COPPER, SERUM: Copper: 101 ug/dL (ref 69–132)

## 2022-04-29 ENCOUNTER — Other Ambulatory Visit: Payer: Self-pay | Admitting: Physician Assistant

## 2022-07-08 ENCOUNTER — Other Ambulatory Visit: Payer: Self-pay | Admitting: Physician Assistant

## 2022-08-21 ENCOUNTER — Other Ambulatory Visit: Payer: Self-pay

## 2022-08-21 DIAGNOSIS — D709 Neutropenia, unspecified: Secondary | ICD-10-CM

## 2022-08-22 ENCOUNTER — Inpatient Hospital Stay: Payer: 59

## 2022-08-22 ENCOUNTER — Inpatient Hospital Stay: Payer: 59 | Attending: Hematology | Admitting: Hematology

## 2022-08-22 VITALS — BP 137/95 | HR 66 | Temp 97.6°F | Resp 16 | Wt 177.5 lb

## 2022-08-22 DIAGNOSIS — G4730 Sleep apnea, unspecified: Secondary | ICD-10-CM | POA: Diagnosis not present

## 2022-08-22 DIAGNOSIS — K648 Other hemorrhoids: Secondary | ICD-10-CM | POA: Diagnosis not present

## 2022-08-22 DIAGNOSIS — K921 Melena: Secondary | ICD-10-CM | POA: Insufficient documentation

## 2022-08-22 DIAGNOSIS — E785 Hyperlipidemia, unspecified: Secondary | ICD-10-CM | POA: Insufficient documentation

## 2022-08-22 DIAGNOSIS — Z8249 Family history of ischemic heart disease and other diseases of the circulatory system: Secondary | ICD-10-CM | POA: Diagnosis not present

## 2022-08-22 DIAGNOSIS — Z811 Family history of alcohol abuse and dependence: Secondary | ICD-10-CM | POA: Insufficient documentation

## 2022-08-22 DIAGNOSIS — Z79899 Other long term (current) drug therapy: Secondary | ICD-10-CM | POA: Diagnosis not present

## 2022-08-22 DIAGNOSIS — Z825 Family history of asthma and other chronic lower respiratory diseases: Secondary | ICD-10-CM | POA: Insufficient documentation

## 2022-08-22 DIAGNOSIS — F1010 Alcohol abuse, uncomplicated: Secondary | ICD-10-CM | POA: Insufficient documentation

## 2022-08-22 DIAGNOSIS — K644 Residual hemorrhoidal skin tags: Secondary | ICD-10-CM | POA: Insufficient documentation

## 2022-08-22 DIAGNOSIS — Z833 Family history of diabetes mellitus: Secondary | ICD-10-CM | POA: Insufficient documentation

## 2022-08-22 DIAGNOSIS — Z87891 Personal history of nicotine dependence: Secondary | ICD-10-CM | POA: Insufficient documentation

## 2022-08-22 DIAGNOSIS — D709 Neutropenia, unspecified: Secondary | ICD-10-CM | POA: Diagnosis not present

## 2022-08-22 DIAGNOSIS — I10 Essential (primary) hypertension: Secondary | ICD-10-CM | POA: Diagnosis not present

## 2022-08-22 DIAGNOSIS — F1910 Other psychoactive substance abuse, uncomplicated: Secondary | ICD-10-CM | POA: Insufficient documentation

## 2022-08-22 LAB — CBC WITH DIFFERENTIAL (CANCER CENTER ONLY)
Abs Immature Granulocytes: 0 10*3/uL (ref 0.00–0.07)
Basophils Absolute: 0 10*3/uL (ref 0.0–0.1)
Basophils Relative: 1 %
Eosinophils Absolute: 0.2 10*3/uL (ref 0.0–0.5)
Eosinophils Relative: 4 %
HCT: 43.7 % (ref 39.0–52.0)
Hemoglobin: 15 g/dL (ref 13.0–17.0)
Immature Granulocytes: 0 %
Lymphocytes Relative: 57 %
Lymphs Abs: 2.4 10*3/uL (ref 0.7–4.0)
MCH: 31.3 pg (ref 26.0–34.0)
MCHC: 34.3 g/dL (ref 30.0–36.0)
MCV: 91.2 fL (ref 80.0–100.0)
Monocytes Absolute: 0.6 10*3/uL (ref 0.1–1.0)
Monocytes Relative: 14 %
Neutro Abs: 1 10*3/uL — ABNORMAL LOW (ref 1.7–7.7)
Neutrophils Relative %: 24 %
Platelet Count: 189 10*3/uL (ref 150–400)
RBC: 4.79 MIL/uL (ref 4.22–5.81)
RDW: 13.2 % (ref 11.5–15.5)
WBC Count: 4.1 10*3/uL (ref 4.0–10.5)
nRBC: 0 % (ref 0.0–0.2)

## 2022-08-22 LAB — CMP (CANCER CENTER ONLY)
ALT: 27 U/L (ref 0–44)
AST: 27 U/L (ref 15–41)
Albumin: 4.4 g/dL (ref 3.5–5.0)
Alkaline Phosphatase: 88 U/L (ref 38–126)
Anion gap: 4 — ABNORMAL LOW (ref 5–15)
BUN: 15 mg/dL (ref 6–20)
CO2: 31 mmol/L (ref 22–32)
Calcium: 9.7 mg/dL (ref 8.9–10.3)
Chloride: 105 mmol/L (ref 98–111)
Creatinine: 1.08 mg/dL (ref 0.61–1.24)
GFR, Estimated: >60 mL/min (ref >60)
Glucose, Bld: 94 mg/dL (ref 70–99)
Potassium: 4.6 mmol/L (ref 3.5–5.1)
Sodium: 140 mmol/L (ref 135–145)
Total Bilirubin: 0.6 mg/dL (ref 0.3–1.2)
Total Protein: 7.9 g/dL (ref 6.5–8.1)

## 2022-08-22 LAB — LACTATE DEHYDROGENASE: LDH: 170 U/L (ref 98–192)

## 2022-08-28 NOTE — Progress Notes (Signed)
Marland Kitchen   HEMATOLOGY/ONCOLOGY CLINIC VISIT NOTE  Date of Service: 08/28/2022  Patient Care Team: [name], [place] as PCP - General (Physician Assistant)  CHIEF COMPLAINTS/PURPOSE OF CONSULTATION:  Neutropenia   HISTORY OF PRESENTING ILLNESS:  Joel [name] is a wonderful 46 y.o. male who has been referred to [place] by Dr .Bufford Buttner, Lelon Mast, PA  for evaluation and management of neutropenia.  Patient has a history of hypertension, dyslipidemia, sleep apnea and had routine labs with his primary care physician on 01/22/2022 with a CBC showing normal hemoglobin of 13.7 with an MCV of 92.7, normal platelets of 186k and WBC count of 3.7k with neutrophil count of 836. Pathologist read of the patient's smear showed leukopenia with neutropenia with no immature cells noted. Patient notes no fevers no chills no night sweats.  No weight loss.  No other new focal symptoms.  No issues with frequent infections.  No recent viral infections or vaccinations.  No other new medications.  Patient denies any other drug use currently.  No cocaine use.  No significant alcohol use.  INTERVAL HISTORY  Patient is here for interval follow-up for leukopenia/neutropenia.  On his last clinic visit about 6 months ago his WBC counts have normalized.  His intermittent leukopenia/neutropenia appears to be related to either heavy alcohol use and possibly other drug use.  He discussed this issue at length.  He was recommended to hold off from all recreational drug use and to minimize or hold off on alcohol use.  He was also recommended to continue taking his vitamin B complex 1 capsule p.o. daily. No fevers no chills no night sweats.  No issues with infections since his last clinic visit.. Labs done today were discussed in detail with the patient.   MEDICAL HISTORY:  Past Medical History:  Diagnosis Date   Hyperlipemia    Hypertension    Sleep apnea     SURGICAL HISTORY: Past Surgical History:  Procedure Laterality  Date   FLEXIBLE SIGMOIDOSCOPY  05/08/2011   Reason for exam: hematochezia; Findings: internal and external hemorrhoids   WISDOM TOOTH EXTRACTION      SOCIAL HISTORY: Social History   Socioeconomic History   Marital status: Single    Spouse name: Not on file   Number of children: 1   Years of education: Not on file   Highest education level: Not on file  Occupational History   Not on file  Tobacco Use   Smoking status: Former    Types: Cigarettes    Start date: 04/28/1989    Quit date: 03/29/2019    Years since quitting: 3.4   Smokeless tobacco: Never  Vaping Use   Vaping Use: Never used  Substance and Sexual Activity   Alcohol use: Yes    Alcohol/week: 3.0 - 4.0 standard drinks of alcohol    Types: 3 - 4 Standard drinks or equivalent per week   Drug use: Not Currently    Comment: 4 years ago   Sexual activity: Yes  Other Topics Concern   Not on file  Social History Narrative   Work for [employer] in [place]   1 daughter -- 1 y/o   Not married   Social Determinants of Health   Financial Resource Strain: Not on file  Food Insecurity: Not on file  Transportation Needs: Not on file  Physical Activity: Not on file  Stress: Not on file  Social Connections: Not on file  Intimate Partner Violence: Not on file    FAMILY HISTORY: Family History  Problem Relation Age of Onset   Hypertension Mother    Alcohol abuse Mother    Asthma Mother    COPD Mother    Drug abuse Mother    Heart disease Mother    Hypertension Sister    Sleep apnea Sister    Alcohol abuse Father    Drug abuse Father    Alcohol abuse Maternal Grandmother    Alcohol abuse Maternal Grandfather    Drug abuse Maternal Grandfather    Diabetes Maternal Grandfather    Colon cancer Neg Hx    Esophageal cancer Neg Hx    Prostate cancer Neg Hx    Rectal cancer Neg Hx    Stomach cancer Neg Hx     ALLERGIES:  has No Known Allergies.  MEDICATIONS:  Current Outpatient Medications  Medication Sig  Dispense Refill   atorvastatin (LIPITOR) 20 MG tablet TAKE 1 TABLET BY MOUTH EVERY DAY 90 tablet 1   gabapentin (NEURONTIN) 300 MG capsule Take 1 capsule (300 mg total) by mouth 3 (three) times daily as needed. 90 capsule 3   hyoscyamine (LEVSIN SL) 0.125 MG SL tablet DISSOLVE ONE TABLET UNDER THE TONGUE EVERY 6 HOURS AS NEEDED 30 tablet 1   IBU 400 MG tablet SMARTSIG:1-2 Tablet(s) By Mouth 3-4 Times Daily PRN     naproxen (NAPROSYN) 500 MG tablet Take 1 tablet (500 mg total) by mouth 2 (two) times daily as needed for mild pain. 30 tablet 0   No current facility-administered medications for this visit.    REVIEW OF SYSTEMS:    10 Point review of Systems was done is negative except as noted above.  PHYSICAL EXAMINATION: ECOG PERFORMANCE STATUS: 1 - Symptomatic but completely ambulatory  . Vitals:   08/22/22 1138  BP: (!) 137/95  Pulse: 66  Resp: 16  Temp: 97.6 F (36.4 C)  SpO2: 100%   Filed Weights   08/22/22 1138  Weight: 177 lb 8 oz (80.5 kg)   .Body mass index is 27.8 kg/m. Marland Kitchen GENERAL:alert, in no acute distress and comfortable SKIN: no acute rashes, no significant lesions EYES: conjunctiva are pink and non-injected, sclera anicteric OROPHARYNX: MMM, no exudates, no oropharyngeal erythema or ulceration NECK: supple, no JVD LYMPH:  no palpable lymphadenopathy in the cervical, axillary or inguinal regions LUNGS: clear to auscultation b/l with normal respiratory effort HEART: regular rate & rhythm ABDOMEN:  normoactive bowel sounds , non tender, not distended. Extremity: no pedal edema PSYCH: alert & oriented x 3 with fluent speech NEURO: no focal motor/sensory deficits   LABORATORY DATA:  I have reviewed the data as listed  .    Latest Ref Rng & Units 08/22/2022   11:25 AM 02/19/2022   12:20 PM 01/22/2022    3:23 PM  CBC  WBC 4.0 - 10.5 K/uL 4.1  5.4  3.7   Hemoglobin 13.0 - 17.0 g/dL 15.9  45.8  59.2   Hematocrit 39.0 - 52.0 % 43.7  43.9    44.1  40.4    Platelets 150 - 400 K/uL 189  210  168   ANC 836  .    Latest Ref Rng & Units 08/22/2022   11:25 AM 02/19/2022   12:20 PM 01/17/2022   10:46 AM  CMP  Glucose 70 - 99 mg/dL 94  924  90   BUN 6 - 20 mg/dL 15  13  13    Creatinine 0.61 - 1.24 mg/dL  4.62  8.63   Sodium 135 - 145 mmol/L 140  140  138   Potassium 3.5 - 5.1 mmol/L 4.6  4.2  4.2   Chloride 98 - 111 mmol/L 105  103  104   CO2 22 - 32 mmol/L 31  31  29    Calcium 8.9 - 10.3 mg/dL 9.7  9.6  9.5   Total Protein 6.5 - 8.1 g/dL 7.9  7.7  7.1   Total Bilirubin 0.3 - 1.2 mg/dL 0.6  0.6  0.5   Alkaline Phos 38 - 126 U/L 88  75  61   AST 15 - 41 U/L 27  39  41   ALT 0 - 44 U/L 27  43  46    . Lab Results  Component Value Date   LDH 170 08/22/2022     RADIOGRAPHIC STUDIES: I have personally reviewed the radiological images as listed and agreed with the findings in the report.  ASSESSMENT & PLAN:   46 year old male with history of hypertension, dyslipidemia, sleep apnea with   #1 Isolated neutropenia. With available labs were discussed in detail.. Patient has had no issues with frequent or severe infections. Plan Labs done today were discussed in detail with the patient. CBC within normal limits other than neutrophil count of 1000.  Previously these were normal.  This suggests a  Insult to the bone marrow causing temporary neutropenia.  This could be from drug use or alcohol use.  Patient is not on any other medications which clearly explain this. Will fevers chills night sweats.  No infection issues.   Follow-up Check-out note: Monitor labs with PCP every 6 months RTC with Dr 57 as needed       Follow-up Labs today Return to clinic with Dr. Candise Che with labs in 6 months  The total time spent in the appointment was 20 minutes*.  All of the patient's questions were answered with apparent satisfaction. The patient knows to call the clinic with any problems, questions or concerns.   Candise Che MD MS  AAHIVMS [facility] Hematology/Oncology Physician [facility]  .*Total Encounter Time as defined by the Centers for Medicare and Medicaid Services includes, in addition to the face-to-face time of a patient visit (documented in the note above) non-face-to-face time: obtaining and reviewing outside history, ordering and reviewing medications, tests or procedures, care coordination (communications with other health care professionals or caregivers) and documentation in the medical record.

## 2023-01-30 ENCOUNTER — Other Ambulatory Visit (HOSPITAL_COMMUNITY)
Admission: RE | Admit: 2023-01-30 | Discharge: 2023-01-30 | Disposition: A | Discharge status: Home / Self Care | Payer: 59 | Source: Ambulatory Visit | Attending: Family | Admitting: Family

## 2023-01-30 ENCOUNTER — Encounter: Payer: Self-pay | Admitting: Family

## 2023-01-30 ENCOUNTER — Ambulatory Visit: Payer: 59 | Admitting: Family

## 2023-01-30 VITALS — BP 126/88 | HR 76 | Temp 97.8°F | Ht 67.0 in | Wt 179.8 lb

## 2023-01-30 DIAGNOSIS — Z202 Contact with and (suspected) exposure to infections with a predominantly sexual mode of transmission: Secondary | ICD-10-CM | POA: Insufficient documentation

## 2023-01-30 LAB — TIQ-NTM

## 2023-01-30 NOTE — Progress Notes (Signed)
Patient ID: Joel [name], male    DOB: [birth year], 47 y.o.   MRN: [id number]  Chief Complaint  Patient presents with   Exposure to STD    Pt states his partner tested positive for Trichomonas. Pt states he is not having any symptoms but partner keeps testing positive after intercourse with him.     HPI:      STI testing:  pt reports his partner tested positive for Trichomonas. Pt states he is not having any symptoms but partner keeps testing positive after intercourse with him. He reports he has tested neg 4 different times and he has not had sex with anyone else. He is wondering if he can be a carrier and keep transferring infection without ever having sx.   Assessment & Plan:  1. STD exposure - denies any sx, advised I can not find a serum blood test for Trich. Can run a urine and genital swab specific for testing Trich in males. Will take 2-3d to get results.  - Trichomonas vaginalis RNA, Ql,Males - Urine cytology ancillary only   Subjective:    Outpatient Medications Prior to Visit  Medication Sig Dispense Refill   atorvastatin (LIPITOR) 20 MG tablet TAKE 1 TABLET BY MOUTH EVERY DAY 90 tablet 1   gabapentin (NEURONTIN) 300 MG capsule Take 1 capsule (300 mg total) by mouth 3 (three) times daily as needed. 90 capsule 3   hyoscyamine (LEVSIN SL) 0.125 MG SL tablet DISSOLVE ONE TABLET UNDER THE TONGUE EVERY 6 HOURS AS NEEDED 30 tablet 1   IBU 400 MG tablet SMARTSIG:1-2 Tablet(s) By Mouth 3-4 Times Daily PRN     naproxen (NAPROSYN) 500 MG tablet Take 1 tablet (500 mg total) by mouth 2 (two) times daily as needed for mild pain. 30 tablet 0   No facility-administered medications prior to visit.   Past Medical History:  Diagnosis Date   Hyperlipemia    Hypertension    Sleep apnea    Past Surgical History:  Procedure Laterality Date   FLEXIBLE SIGMOIDOSCOPY  05/08/2011   Reason for exam: hematochezia; Findings: internal and external hemorrhoids   WISDOM TOOTH EXTRACTION      No Known Allergies    Objective:    Physical Exam Vitals and nursing note reviewed.  Constitutional:      General: He is not in acute distress.    Appearance: Normal appearance.  HENT:     Head: Normocephalic.  Cardiovascular:     Rate and Rhythm: Normal rate and regular rhythm.  Pulmonary:     Effort: Pulmonary effort is normal.     Breath sounds: Normal breath sounds.  Musculoskeletal:        General: Normal range of motion.     Cervical back: Normal range of motion.  Skin:    General: Skin is warm and dry.  Neurological:     Mental Status: He is alert and oriented to person, place, and time.  Psychiatric:        Mood and Affect: Mood normal.    BP 126/88 (BP Location: Left Arm, Patient Position: Sitting, Cuff Size: Large)   Pulse 76   Temp 97.8 F (36.6 C) (Temporal)   Ht 5\' 7"  (1.702 m)   Wt 179 lb 12.8 oz (81.6 kg)   SpO2 99%   BMI 28.16 kg/m  Wt Readings from Last 3 Encounters:  01/30/23 179 lb 12.8 oz (81.6 kg)  08/22/22 177 lb 8 oz (80.5 kg)  02/19/22 181 lb 9.6 oz (  82.4 kg)       Dulce Sellar, NP

## 2023-02-03 LAB — URINE CYTOLOGY ANCILLARY ONLY
Chlamydia: NEGATIVE
Comment: NEGATIVE
Comment: NEGATIVE
Comment: NORMAL
Neisseria Gonorrhea: NEGATIVE
Trichomonas: NEGATIVE

## 2023-02-03 LAB — TIQ-NTM

## 2023-02-03 LAB — TRICHOMONAS VAGINALIS RNA, QL,MALES

## 2023-02-04 NOTE — Progress Notes (Signed)
Please ask whomever in the lab why the Trichomonos RNA test was canceled?

## 2023-02-16 NOTE — Progress Notes (Signed)
Subjective:    Joel [name] is a 47 y.o. male and is here for a comprehensive physical exam.  HPI  There are no preventive care reminders to display for this patient.  Chief Complaint  Patient presents with   Annual Exam    Not fasting   Acute Concerns: STD exposure: He states his partner has tested positive for Trichomoniasis.  He was made aware 2 days before testing negative on 4/12.  He is interested in testing again.  Denies sx.  Chronic Issues: Hyperlipidemia: He is compliant with 20 mg Atorvastatin daily.   Hypertension: His blood pressure is well controlled. BP Readings from Last 3 Encounters:  02/17/23 120/80  01/30/23 126/88  08/22/22 (!) 137/95   Sleep apnea: He has not been consistently using his CPAP machine at night.   Health Maintenance: Immunizations -- UTD Colonoscopy -- 08/27/2020. Repeat 10 years. Due 2031. PSA -- No results found for: "PSA1", "PSA" Diet -- He has been trying to eat healthier.  Sleep habits -- No concerns Exercise -- He has been trying to stay more active.   Weight --  Recent weight history Wt Readings from Last 10 Encounters:  02/17/23 178 lb 6.1 oz (80.9 kg)  01/30/23 179 lb 12.8 oz (81.6 kg)  08/22/22 177 lb 8 oz (80.5 kg)  02/19/22 181 lb 9.6 oz (82.4 kg)  01/23/22 180 lb (81.6 kg)  01/17/22 184 lb (83.5 kg)  11/08/20 181 lb 4 oz (82.2 kg)  09/27/20 180 lb 6.4 oz (81.8 kg)  08/27/20 175 lb (79.4 kg)  07/11/20 175 lb (79.4 kg)   Body mass index is 27.94 kg/m.  Mood -- Stable Alcohol use --  reports current alcohol use of about 3.0 - 4.0 standard drinks of alcohol per week.  Tobacco use --  Tobacco Use: Medium Risk (02/17/2023)   Patient History    Smoking Tobacco Use: Former    Smokeless Tobacco Use: Never    Passive Exposure: Not on file    Eligible for Low Dose CT? No  UTD with eye doctor? UTD. He uses reading glasses. UTD with dentist? UTD     02/17/2023    1:35 PM  Depression screen PHQ 2/9   Decreased Interest 0  Down, Depressed, Hopeless 0  PHQ - 2 Score 0    Other providers/specialists: Patient Care Team: [name], [place] as PCP - General (Physician Assistant)    PMHx, SurgHx, SocialHx, Medications, and Allergies were reviewed in the Visit Navigator and updated as appropriate.   Past Medical History:  Diagnosis Date   Hyperlipemia    Hypertension    Sleep apnea      Past Surgical History:  Procedure Laterality Date   FLEXIBLE SIGMOIDOSCOPY  05/08/2011   Reason for exam: hematochezia; Findings: internal and external hemorrhoids   WISDOM TOOTH EXTRACTION       Family History  Problem Relation Age of Onset   Hypertension Mother    Alcohol abuse Mother    Asthma Mother    COPD Mother    Drug abuse Mother    Heart disease Mother    Hypertension Sister    Sleep apnea Sister    Alcohol abuse Father    Drug abuse Father    Alcohol abuse Maternal Grandmother    Alcohol abuse Maternal Grandfather    Drug abuse Maternal Grandfather    Diabetes Maternal Grandfather    Colon cancer Neg Hx    Esophageal cancer Neg Hx    Prostate cancer  Neg Hx    Rectal cancer Neg Hx    Stomach cancer Neg Hx     Social History   Tobacco Use   Smoking status: Former    Types: Cigarettes    Start date: 04/28/1989    Quit date: 03/29/2019    Years since quitting: 3.8   Smokeless tobacco: Never  Vaping Use   Vaping Use: Never used  Substance Use Topics   Alcohol use: Yes    Alcohol/week: 3.0 - 4.0 standard drinks of alcohol    Types: 3 - 4 Standard drinks or equivalent per week   Drug use: Not Currently    Comment: 4 years ago    Review of Systems:   Review of Systems  Constitutional:  Negative for chills, fever, malaise/fatigue and weight loss.  HENT:  Negative for hearing loss, sinus pain and sore throat.   Respiratory:  Negative for cough and hemoptysis.   Cardiovascular:  Negative for chest pain, palpitations, leg swelling and PND.  Gastrointestinal:   Negative for abdominal pain, constipation, diarrhea, heartburn, nausea and vomiting.  Genitourinary:  Negative for dysuria, frequency and urgency.  Musculoskeletal:  Negative for back pain, myalgias and neck pain.  Skin:  Negative for itching and rash.  Neurological:  Negative for dizziness, tingling, seizures and headaches.  Endo/Heme/Allergies:  Negative for polydipsia.  Psychiatric/Behavioral:  Negative for depression. The patient is not nervous/anxious.     Objective:    Vitals:   02/17/23 1333  BP: 120/80  Pulse: 79  Temp: (!) 97.5 F (36.4 C)  SpO2: 99%    Body mass index is 27.94 kg/m.  General  Alert, cooperative, no distress, appears stated age  Head:  Normocephalic, without obvious abnormality, atraumatic  Eyes:  PERRL, conjunctiva/corneas clear, EOM's intact, fundi benign, both eyes       Ears:  Normal TM's and external ear canals, both ears  Nose: Nares normal, septum midline, mucosa normal, no drainage or sinus tenderness  Throat: Lips, mucosa, and tongue normal; teeth and gums normal  Neck: Supple, symmetrical, trachea midline, no adenopathy;     thyroid:  No enlargement/tenderness/nodules; no carotid bruit or JVD  Back:   Symmetric, no curvature, ROM normal, no CVA tenderness  Lungs:   Clear to auscultation bilaterally, respirations unlabored  Chest wall:  No tenderness or deformity  Heart:  Regular rate and rhythm, S1 and S2 normal, no murmur, rub or gallop  Abdomen:   Soft, non-tender, bowel sounds active all four quadrants, no masses, no organomegaly  Extremities: Extremities normal, atraumatic, no cyanosis or edema  Prostate : Deferred   Skin: Skin color, texture, turgor normal, no rashes or lesions  Lymph nodes: Cervical, supraclavicular, and axillary nodes normal  Neurologic: CNII-XII grossly intact. Normal strength, sensation and reflexes throughout   AssessmentPlan:   Routine physical examination Today patient counseled on age appropriate routine  health concerns for screening and prevention, each reviewed and up to date or declined. Immunizations reviewed and up to date or declined. Labs ordered and reviewed. Risk factors for depression reviewed and negative. Hearing function and visual acuity are intact. ADLs screened and addressed as needed. Functional ability and level of safety reviewed and appropriate. Education, counseling and referrals performed based on assessed risks today. Patient provided with a copy of personalized plan for preventive services.  STD exposure Update Trich screening per patient request Follow-up if any concerns Avoid sexual intercourse until testing returned  Hyperlipidemia, unspecified hyperlipidemia type Update lipid panel and adjust lipitor 20 mg  daily as indicated  Primary hypertension Normotensive Continue healthy lifestyle    I,Rachel Rivera,acting as a scribe for [employer], PA.,have documented all relevant documentation on the behalf of [name], PA,as directed by  [name], PA while in the presence of [name], [place].  I, [name], [place], have reviewed all documentation for this visit. The documentation on 02/17/23 for the exam, diagnosis, procedures, and orders are all accurate and complete.   [name], PA-C [facility]

## 2023-02-17 ENCOUNTER — Other Ambulatory Visit (HOSPITAL_COMMUNITY)
Admission: RE | Admit: 2023-02-17 | Discharge: 2023-02-17 | Disposition: A | Discharge status: Home / Self Care | Payer: 59 | Source: Ambulatory Visit | Attending: Physician Assistant | Admitting: Physician Assistant

## 2023-02-17 ENCOUNTER — Encounter: Payer: Self-pay | Admitting: Physician Assistant

## 2023-02-17 ENCOUNTER — Ambulatory Visit (INDEPENDENT_AMBULATORY_CARE_PROVIDER_SITE_OTHER): Payer: 59 | Admitting: Physician Assistant

## 2023-02-17 VITALS — BP 120/80 | HR 79 | Temp 97.5°F | Ht 67.0 in | Wt 178.4 lb

## 2023-02-17 DIAGNOSIS — Z202 Contact with and (suspected) exposure to infections with a predominantly sexual mode of transmission: Secondary | ICD-10-CM

## 2023-02-17 DIAGNOSIS — Z0000 Encounter for general adult medical examination without abnormal findings: Secondary | ICD-10-CM

## 2023-02-17 DIAGNOSIS — I10 Essential (primary) hypertension: Secondary | ICD-10-CM

## 2023-02-17 DIAGNOSIS — E785 Hyperlipidemia, unspecified: Secondary | ICD-10-CM | POA: Diagnosis not present

## 2023-02-17 LAB — COMPREHENSIVE METABOLIC PANEL
ALT: 31 U/L (ref 0–53)
AST: 30 U/L (ref 0–37)
Albumin: 4.2 g/dL (ref 3.5–5.2)
Alkaline Phosphatase: 76 U/L (ref 39–117)
BUN: 13 mg/dL (ref 6–23)
CO2: 29 mEq/L (ref 19–32)
Calcium: 9.1 mg/dL (ref 8.4–10.5)
Chloride: 104 mEq/L (ref 96–112)
Creatinine, Ser: 1.08 mg/dL (ref 0.40–1.50)
GFR: 81.09 mL/min (ref >60.00)
Glucose, Bld: 88 mg/dL (ref 70–99)
Potassium: 4.1 mEq/L (ref 3.5–5.1)
Sodium: 139 mEq/L (ref 135–145)
Total Bilirubin: 0.4 mg/dL (ref 0.2–1.2)
Total Protein: 7.2 g/dL (ref 6.0–8.3)

## 2023-02-17 LAB — CBC WITH DIFFERENTIAL/PLATELET
Basophils Absolute: 0 10*3/uL (ref 0.0–0.1)
Basophils Relative: 0.5 % (ref 0.0–3.0)
Eosinophils Absolute: 0.1 10*3/uL (ref 0.0–0.7)
Eosinophils Relative: 3.2 % (ref 0.0–5.0)
HCT: 41.5 % (ref 39.0–52.0)
Hemoglobin: 14.2 g/dL (ref 13.0–17.0)
Lymphocytes Relative: 60.8 % — ABNORMAL HIGH (ref 12.0–46.0)
Lymphs Abs: 2.8 10*3/uL (ref 0.7–4.0)
MCHC: 34.1 g/dL (ref 30.0–36.0)
MCV: 93.1 fl (ref 78.0–100.0)
Monocytes Absolute: 0.7 10*3/uL (ref 0.1–1.0)
Monocytes Relative: 14.8 % — ABNORMAL HIGH (ref 3.0–12.0)
Neutro Abs: 1 10*3/uL — ABNORMAL LOW (ref 1.4–7.7)
Neutrophils Relative %: 20.7 % — ABNORMAL LOW (ref 43.0–77.0)
Platelets: 203 10*3/uL (ref 150.0–400.0)
RBC: 4.46 Mil/uL (ref 4.22–5.81)
RDW: 13.1 % (ref 11.5–15.5)
WBC: 4.6 10*3/uL (ref 4.0–10.5)

## 2023-02-17 LAB — LIPID PANEL
Cholesterol: 132 mg/dL (ref 0–200)
HDL: 37.4 mg/dL — ABNORMAL LOW (ref >39.00)
LDL Cholesterol: 84 mg/dL (ref 0–99)
NonHDL: 94.44
Total CHOL/HDL Ratio: 4
Triglycerides: 54 mg/dL (ref 0.0–149.0)
VLDL: 10.8 mg/dL (ref 0.0–40.0)

## 2023-02-17 NOTE — Patient Instructions (Addendum)
It was great to see you!  Your shoulder provider --> Dr Clementeen Graham at [facility].  Their location:  [facility]  [address] on the 1st floor Phone number [phone] Fax [phone].   Your gastro doctor is Dr Amada Jupiter - please send him a mychart message to update your family history to see if this changes when you are due for your next colonoscopy  Please go to the lab for blood work.   Our office will call you with your results unless you have chosen to receive results via MyChart.  If your blood work is normal we will follow-up each year for physicals and as scheduled for chronic medical problems.  If anything is abnormal we will treat accordingly and get you in for a follow-up.  Take care,  Lelon Mast

## 2023-02-18 ENCOUNTER — Other Ambulatory Visit: Payer: Self-pay | Admitting: Physician Assistant

## 2023-02-18 ENCOUNTER — Encounter: Payer: Self-pay | Admitting: Physician Assistant

## 2023-02-18 LAB — CERVICOVAGINAL ANCILLARY ONLY
Comment: NEGATIVE
Trichomonas: POSITIVE — AB

## 2023-02-18 MED ORDER — METRONIDAZOLE 500 MG PO TABS: 2000.0000 mg | ORAL_TABLET | Freq: Once | ORAL | 0 refills | Status: AC

## 2023-02-18 NOTE — Telephone Encounter (Signed)
See result notes. 

## 2023-03-10 ENCOUNTER — Other Ambulatory Visit: Payer: Self-pay | Admitting: Physician Assistant

## 2023-09-02 ENCOUNTER — Other Ambulatory Visit: Payer: Self-pay | Admitting: Physician Assistant

## 2023-12-03 ENCOUNTER — Other Ambulatory Visit: Payer: Self-pay

## 2023-12-03 ENCOUNTER — Ambulatory Visit: Payer: 59 | Admitting: Family Medicine

## 2023-12-03 VITALS — BP 124/84 | HR 91 | Ht 67.0 in | Wt 181.0 lb

## 2023-12-03 DIAGNOSIS — G8929 Other chronic pain: Secondary | ICD-10-CM | POA: Diagnosis not present

## 2023-12-03 DIAGNOSIS — M25521 Pain in right elbow: Secondary | ICD-10-CM

## 2023-12-03 DIAGNOSIS — M7701 Medial epicondylitis, right elbow: Secondary | ICD-10-CM | POA: Diagnosis not present

## 2023-12-03 NOTE — Progress Notes (Unsigned)
   IPhilbert Riser, PhD, LAT, ATC acting as a scribe for Clementeen Graham, MD.  Joel [name] is a 48 y.o. male who presents to [facility] at [facility] today for R elbow pain. Pt was previously seen by Dr. Denyse Amass in 2023 for L-sided cervical radiculitis.  Today, pt c/o R elbow pain ongoing a couple years, worsening over the last 65-months. Pt locates pain to the medial aspect of his R elbow. 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   Radiates: yes- into forearm and hand Paresthesia: yes- whole and fingers Grip strength: no Aggravates: using a shovel/rake Treatments tried: none  Pertinent review of systems: No fevers or chills  Relevant historical information: Hypertension. Patient has blood but is any of Joel [name].   Exam:  BP 124/84   Pulse 91   Ht 5\' 7"  (1.702 m)   Wt 181 lb (82.1 kg)   SpO2 97%   BMI 28.35 kg/m  General: Well Developed, well nourished, and in no acute distress.   MSK: Right elbow normal. Tender palpation medial joint line Normal elbow motion. Some pain present with wrist flexion. Pulses cap refill and sensation intact distally. Send positive Tinel's at cubital tunnel.  Negative Tinel's carpal tunnel.    Lab and Radiology Results  [facility] Ultrasound of: Right medial epicondyle No visible tear present at the common flexor tendon origin. Impression: Medial epicondylitis      Assessment and Plan: 48 y.o. male with right medial elbow pain.  This is a chronic ongoing issue.  Plan for home exercise program taught in clinic today by ATC.  If this is not sufficient next step would be occupational therapy referral.  He will let me know.   PDMP not reviewed this encounter. Orders Placed This Encounter  Procedures   [place] LIMITED JOINT SPACE STRUCTURES UP RIGHT(NO LINKED CHARGES)    Reason for Exam (SYMPTOM  OR DIAGNOSIS REQUIRED):   right elbow pain    Preferred imaging location?:    [facility]   No orders of the defined types were placed in this encounter.    Discussed warning signs or symptoms. Please see discharge instructions. Patient expresses understanding.   The above documentation has been reviewed and is accurate and complete Clementeen Graham, M.D.

## 2023-12-03 NOTE — Patient Instructions (Addendum)
Thank you for coming in today.   Please work on the home exercises the athletic trainer went over with you:  View at [email or web address] using code: [id number]  Try the exercises for 2-3 weeks and if not better, we will refer to hand therapy.

## 2024-02-19 ENCOUNTER — Encounter: Payer: 59 | Admitting: Physician Assistant

## 2024-02-24 NOTE — Progress Notes (Incomplete)
 Subjective:    Joel [name]  is a 48 y.o. male and is here for a comprehensive physical exam.  HPI  Health Maintenance Due  Topic Date Due   COVID-19 Vaccine (4 - 2024-25 season) 06/21/2023    Acute Concerns: ***  Chronic Issues: ***  Health Maintenance: Immunizations -- *** Colonoscopy -- UTD, last done 08/27/2020. Prolapsed hypertrophied anal papilla, diverticulosis, and int hemorrhoids found. Repeat 10 years, next due 2031.  PSA -- No results found for: "PSA1", "PSA" Diet -- *** Exercise -- *** Sleep habits -- ***No concerns.    Weight --  Recent weight history Wt Readings from Last 10 Encounters:  12/03/23 181 lb (82.1 kg)  02/17/23 178 lb 6.1 oz (80.9 kg)  01/30/23 179 lb 12.8 oz (81.6 kg)  08/22/22 177 lb 8 oz (80.5 kg)  02/19/22 181 lb 9.6 oz (82.4 kg)  01/23/22 180 lb (81.6 kg)  01/17/22 184 lb (83.5 kg)  11/08/20 181 lb 4 oz (82.2 kg)  09/27/20 180 lb 6.4 oz (81.8 kg)  08/27/20 175 lb (79.4 kg)   There is no height or weight on file to calculate BMI.  Mood -- ***Stable.  Alcohol use --  reports current alcohol use of about 3.0 - 4.0 standard drinks of alcohol per week.  Tobacco use --  Tobacco Use: Medium Risk (02/17/2023)   Patient History    Smoking Tobacco Use: Former    Smokeless Tobacco Use: Never    Passive Exposure: Not on file    Eligible for Low Dose CT?  UTD with eye doctor? *** UTD with dentist? ***     02/17/2023    1:35 PM  Depression screen PHQ 2/9  Decreased Interest 0  Down, Depressed, Hopeless 0  PHQ - 2 Score 0    Other providers/specialists: Patient Care Team: Alexander Iba, [place] as PCP - General (Physician Assistant)    PMHx, SurgHx, SocialHx, Medications, and Allergies were reviewed in the Visit Navigator and updated as appropriate.   Past Medical History:  Diagnosis Date   Hyperlipemia    Hypertension    Sleep apnea      Past Surgical History:  Procedure Laterality Date   FLEXIBLE SIGMOIDOSCOPY   05/08/2011   Reason for exam: hematochezia; Findings: internal and external hemorrhoids   WISDOM TOOTH EXTRACTION       Family History  Problem Relation Age of Onset   Hypertension Mother    Alcohol abuse Mother    Asthma Mother    COPD Mother    Drug abuse Mother    Heart disease Mother    Hypertension Sister    Sleep apnea Sister    Alcohol abuse Father    Drug abuse Father    Alcohol abuse Maternal Grandmother    Alcohol abuse Maternal Grandfather    Drug abuse Maternal Grandfather    Diabetes Maternal Grandfather    Colon cancer Neg Hx    Esophageal cancer Neg Hx    Prostate cancer Neg Hx    Rectal cancer Neg Hx    Stomach cancer Neg Hx     Social History   Tobacco Use   Smoking status: Former    Current packs/day: 0.00    Types: Cigarettes    Start date: 04/28/1989    Quit date: 03/29/2019    Years since quitting: 4.9   Smokeless tobacco: Never  Vaping Use   Vaping status: Never Used  Substance Use Topics   Alcohol use: Yes    Alcohol/week: 3.0 -  4.0 standard drinks of alcohol    Types: 3 - 4 Standard drinks or equivalent per week   Drug use: Not Currently    Comment: 4 years ago    Review of Systems:   ROS - Negative unless otherwise specified per HPI.   Objective:   There were no vitals filed for this visit.  There is no height or weight on file to calculate BMI.  General  Alert, cooperative, no distress, appears stated age  Head:  Normocephalic, without obvious abnormality, atraumatic  Eyes:  PERRL, conjunctiva/corneas clear, EOM's intact, fundi benign, both eyes       Ears:  Normal TM's and external ear canals, both ears  Nose: Nares normal, septum midline, mucosa normal, no drainage or sinus tenderness  Throat: Lips, mucosa, and tongue normal; teeth and gums normal  Neck: Supple, symmetrical, trachea midline, no adenopathy;     thyroid :  No enlargement/tenderness/nodules; no carotid bruit or JVD  Back:   Symmetric, no curvature, ROM normal,  no CVA tenderness  Lungs:   Clear to auscultation bilaterally, respirations unlabored  Chest wall:  No tenderness or deformity  Heart:  Regular rate and rhythm, S1 and S2 normal, no murmur, rub or gallop  Abdomen:   Soft, non-tender, bowel sounds active all four quadrants, no masses, no organomegaly  Extremities: Extremities normal, atraumatic, no cyanosis or edema  Prostate : ***   Skin: Skin color, texture, turgor normal, no rashes or lesions  Lymph nodes: Cervical, supraclavicular, and axillary nodes normal  Neurologic: CNII-XII grossly intact. Normal strength, sensation and reflexes throughout   AssessmentPlan:   There are no diagnoses linked to this encounter.   I, Bernita Bristle, acting as a Neurosurgeon for Alexander Iba, [place]., have documented all relevant documentation on the behalf of Alexander Iba, [place], as directed by   while in the presence of Alexander Iba, [place].  I, Alexander Iba, [place], have reviewed all documentation for this visit. The documentation on 02/24/24 for the exam, diagnosis, procedures, and orders are all accurate and complete.  Alexander Iba, PA-C [facility]

## 2024-02-25 ENCOUNTER — Encounter: Payer: 59 | Admitting: Physician Assistant

## 2024-04-15 IMAGING — XA DG INJECT/[PERSON_NAME] INC NEEDLE/CATH/PLC EPI/CERV/THOR W/IMG
2 series · 2 of 2 positions shown · non-contrast
Comparison: none

CLINICAL DATA: Left cervical radiculopathy.

[Series 1: ortho standard · 1 of 1 slices shown (1 of 2)]
[im 1/1]
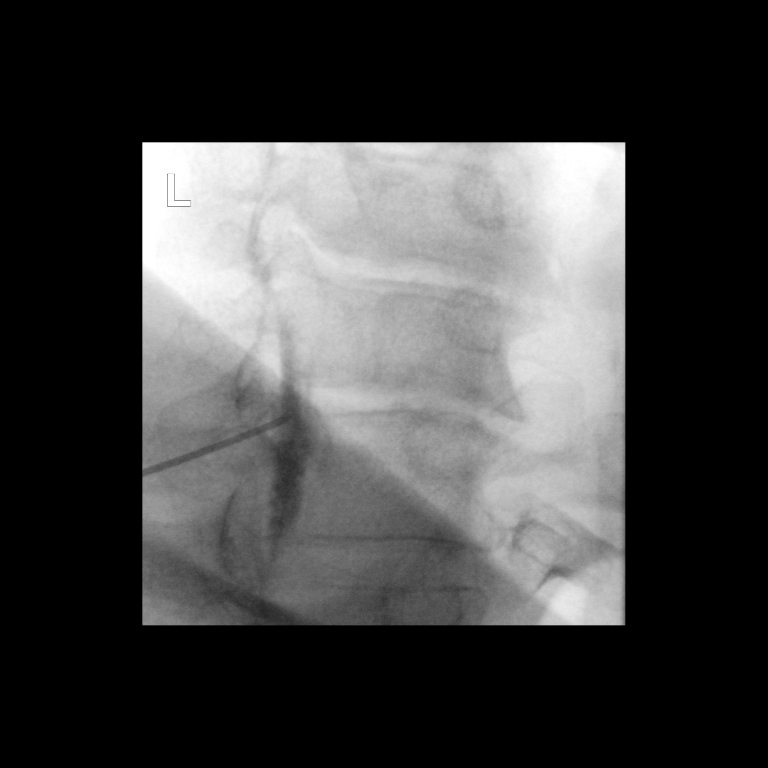

[Series 2: ortho standard · 1 of 1 slices shown (2 of 2)]
[im 1/1]
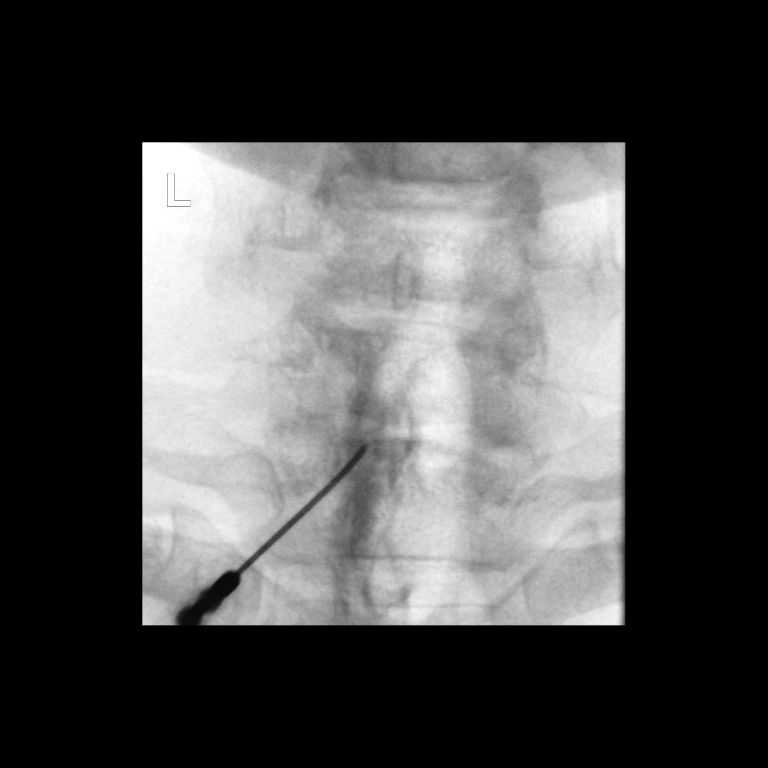

[2 of 2 positions shown; findings below may reference images not displayed]

FLUOROSCOPY:
Radiation Exposure Index (as provided by the fluoroscopic device):
Dose area product 6.31 uGy*m2

PROCEDURE:
CERVICAL EPIDURAL INJECTION

An interlaminar approach was performed on the left at C7-T1. A 20
gauge epidural needle was advanced using loss-of-resistance
technique.

DIAGNOSTIC EPIDURAL INJECTION

Injection of Isovue-M 300 shows a good epidural pattern with spread
above and below the level of needle placement, primarily on the
left. No vascular opacification is seen. THERAPEUTIC

EPIDURAL INJECTION

1.5 ml of Kenalog 40 mixed with 1 ml of 1% Lidocaine and 2 ml of
normal saline were then instilled. The procedure was well-tolerated,
and the patient was discharged thirty minutes following the
injection in good condition.
IMPRESSION: Technically successful first epidural injection on the left at
C7-T1.
